# Patient Record
Sex: Female | Born: 1940 | Race: White | Hispanic: No | Marital: Married | Smoking: Never smoker
Health system: Southern US, Community
[De-identification: ages and names within clinical notes are randomized; demographics above are authoritative.]

## PROBLEM LIST (undated history)

## (undated) DIAGNOSIS — I10 Essential (primary) hypertension: Secondary | ICD-10-CM

## (undated) DIAGNOSIS — M1990 Unspecified osteoarthritis, unspecified site: Secondary | ICD-10-CM

## (undated) DIAGNOSIS — E039 Hypothyroidism, unspecified: Secondary | ICD-10-CM

## (undated) DIAGNOSIS — E7800 Pure hypercholesterolemia, unspecified: Secondary | ICD-10-CM

## (undated) DIAGNOSIS — I6609 Occlusion and stenosis of unspecified middle cerebral artery: Secondary | ICD-10-CM

## (undated) DIAGNOSIS — E119 Type 2 diabetes mellitus without complications: Secondary | ICD-10-CM

## (undated) DIAGNOSIS — I639 Cerebral infarction, unspecified: Secondary | ICD-10-CM

## (undated) HISTORY — PX: APPENDECTOMY: SHX54

## (undated) HISTORY — PX: CHOLECYSTECTOMY: SHX55

## (undated) HISTORY — PX: ABDOMINAL HYSTERECTOMY: SHX81

## (undated) HISTORY — DX: Occlusion and stenosis of unspecified middle cerebral artery: I66.09

## (undated) HISTORY — DX: Hypothyroidism, unspecified: E03.9

## (undated) HISTORY — DX: Cerebral infarction, unspecified: I63.9

## (undated) HISTORY — PX: ABDOMINAL HYSTERECTOMY: SUR658

## (undated) HISTORY — PX: COLONOSCOPY: SHX174

## (undated) HISTORY — DX: Type 2 diabetes mellitus without complications: E11.9

## (undated) HISTORY — DX: Pure hypercholesterolemia, unspecified: E78.00

---

## 2000-04-03 ENCOUNTER — Encounter: Payer: Self-pay | Admitting: *Deleted

## 2000-04-03 ENCOUNTER — Encounter: Admission: RE | Admit: 2000-04-03 | Discharge: 2000-04-03 | Payer: Self-pay | Admitting: *Deleted

## 2000-08-08 ENCOUNTER — Other Ambulatory Visit: Admission: RE | Admit: 2000-08-08 | Discharge: 2000-08-08 | Payer: Self-pay | Admitting: Obstetrics and Gynecology

## 2001-12-08 ENCOUNTER — Other Ambulatory Visit: Admission: RE | Admit: 2001-12-08 | Discharge: 2001-12-08 | Payer: Self-pay | Admitting: Obstetrics and Gynecology

## 2002-10-22 ENCOUNTER — Ambulatory Visit (HOSPITAL_COMMUNITY): Admission: RE | Admit: 2002-10-22 | Discharge: 2002-10-22 | Payer: Self-pay | Admitting: Family Medicine

## 2002-10-22 ENCOUNTER — Encounter: Payer: Self-pay | Admitting: Family Medicine

## 2003-06-04 ENCOUNTER — Ambulatory Visit (HOSPITAL_COMMUNITY): Admission: RE | Admit: 2003-06-04 | Discharge: 2003-06-04 | Payer: Self-pay | Admitting: Family Medicine

## 2003-06-04 ENCOUNTER — Encounter: Payer: Self-pay | Admitting: Family Medicine

## 2004-06-22 ENCOUNTER — Ambulatory Visit: Admission: RE | Admit: 2004-06-22 | Discharge: 2004-06-22 | Payer: Self-pay | Admitting: Family Medicine

## 2004-07-06 ENCOUNTER — Inpatient Hospital Stay (HOSPITAL_COMMUNITY): Admission: EM | Admit: 2004-07-06 | Discharge: 2004-07-11 | Payer: Self-pay | Admitting: Emergency Medicine

## 2004-11-01 ENCOUNTER — Ambulatory Visit (HOSPITAL_COMMUNITY): Admission: RE | Admit: 2004-11-01 | Discharge: 2004-11-01 | Payer: Self-pay | Admitting: Neurology

## 2004-12-01 ENCOUNTER — Ambulatory Visit (HOSPITAL_COMMUNITY): Admission: RE | Admit: 2004-12-01 | Discharge: 2004-12-01 | Payer: Self-pay | Admitting: Neurology

## 2005-06-05 ENCOUNTER — Ambulatory Visit (HOSPITAL_COMMUNITY): Admission: RE | Admit: 2005-06-05 | Discharge: 2005-06-05 | Payer: Self-pay | Admitting: Family Medicine

## 2005-09-20 ENCOUNTER — Ambulatory Visit (HOSPITAL_COMMUNITY): Admission: RE | Admit: 2005-09-20 | Discharge: 2005-09-20 | Payer: Self-pay | Admitting: Neurology

## 2005-09-26 ENCOUNTER — Encounter: Admission: RE | Admit: 2005-09-26 | Discharge: 2005-09-26 | Payer: Self-pay | Admitting: Neurology

## 2007-09-10 ENCOUNTER — Ambulatory Visit (HOSPITAL_COMMUNITY): Admission: RE | Admit: 2007-09-10 | Discharge: 2007-09-10 | Payer: Self-pay | Admitting: Family Medicine

## 2009-03-17 ENCOUNTER — Encounter: Admission: RE | Admit: 2009-03-17 | Discharge: 2009-03-17 | Payer: Self-pay | Admitting: Neurology

## 2009-04-04 ENCOUNTER — Encounter: Admission: RE | Admit: 2009-04-04 | Discharge: 2009-04-04 | Payer: Self-pay | Admitting: Family Medicine

## 2010-11-06 ENCOUNTER — Encounter: Admission: RE | Admit: 2010-11-06 | Discharge: 2010-11-06 | Payer: Self-pay | Admitting: Family Medicine

## 2011-05-18 NOTE — H&P (Signed)
[name], [name]                          ACCOUNT NO.:  [id number]   MEDICAL RECORD NO.:  [id number]                   PATIENT TYPE:  INP   LOCATION:  3036                                 FACILITY:  [facility]   PHYSICIAN:  [name], M.D.            DATE OF BIRTH:  06-16-1941   DATE OF ADMISSION:  07/06/2004  DATE OF DISCHARGE:                                HISTORY & PHYSICAL   PRIMARY CARE PHYSICIAN:  [name], M.D.   CHIEF COMPLAINT:  Numbness of left arm along with numbness of mouth and  tongue.   HISTORY OF PRESENT ILLNESS:  This is a 70 year old white female with past  medical history of hypertension and hypothyroidism who in the past month has  had two episodes of TIA-like symptoms.  She tells me that the first episode  started well over a month ago, and at that time she started having some  problems with left arm numbness and also some facial numbness.  Then again  after a second episode, the patient had already been on aspirin, and Plavix  was added to her regimen.  The patient at that time had an extensive workup  including an echocardiogram for which results are not available, but  according to her primary care physician were normal.  Carotid Dopplers were  also unremarkable, and an MRI/MRA of the brain done June 22, 2004.  The MRI  showed a tiny acute nonhemorrhagic infarct at the right centrum semiovale  and several scattered nonspecific tiny white matter type changes.  A  subcentimeter meningioma posterior to the crista galli  was noted without  any associated mass effect.  She was also noted to have on the MRA some  atherosclerotic disease.   The patient then was started on Plavix, and she since then has done  relatively well.  This morning she woke up and started complaining of some  left arm numbness with radiation to her jaw and mouth.  She had no  difficulty speaking, no problems with confusion, and no visual changes.  The  patient also states  that she felt like the numbness was going down her back  and into both of her legs.  This episode lasted approximately 30 minutes and  was somewhat relatively similar to her previous TIA episodes.  She became  concerned and called her primary care physician who advised her to come into  the emergency room.   In the emergency room after speaking with Dr. Tiburcio Travis, her primary care  physician, it was felt that patient would not need any further workup given  that she had already had a full extensive workup recently.  However, given  the fact that she has continued to have these TIA-like symptoms despite  being on aspirin and Plavix, it was felt the patient should be admitted for  Coumadin therapy to be started.  The patient herself tells me that her  symptoms  have completely resolved 30 minutes after they initiated, and she  now feels back to her baseline completely.  She has had no residual effects  from any of her TIAs.  Currently the patient denies any headaches or visual  changes.  She denies any dysphagia.  She denies any facial or arm numbness.  She denies any nausea, vomiting, chest pain, palpitations, shortness of  breath, wheeze, cough, abdominal pain, hematuria, dysuria, constipation,  diarrhea.  She denies any focal extremity weakness or numbness.   PAST MEDICAL HISTORY:  1. Hypertension.  2. Hypothyroidism.  3. She tells me she has also had a stress test in the past which was     negative.   MEDICATIONS:  1. Aspirin 325 mg daily.  2. Lisinopril 20 mg daily.  3. Plavix 75 mg daily.  4. Bisoprolol/hydrochlorothiazide 5/6.25 p.o. daily.  5. Os-Cal D 600 mg p.o. daily.   ALLERGIES:  No known drug allergies.   SOCIAL HISTORY:  She denies any tobacco, alcohol, or drug use.  She lives  with her husband.   FAMILY HISTORY:  Noncontributory.   PHYSICAL EXAMINATION:  VITAL SIGNS:  She is in normal sinus rhythm with a  heart rate of 68.  O2 saturation 98% on room air.   Respirations 20, blood  pressure initially elevated at 190/82.  Since then, her systolic has come  down to the 160s.  GENERAL:  Alert and oriented x 3 in no apparent distress.  HEENT:  Normocephalic and atraumatic.  She has no cranial nerve deficits.  NECK:  She has no carotid bruits.  HEART:  Regular rate and rhythm with what sounds like an S4 murmur.  LUNGS:  Clear to auscultation bilaterally.  ABDOMEN:  Soft, nontender, nondistended.  Positive bowel sounds.  EXTREMITIES:  No clubbing, cyanosis, or edema.  She has 2+ pulses.  NEUROLOGIC:  She has no focal neurological deficits.  She is negative for  Babinski sign.  She has no pronator drift.  She has no evidence of any  cerebellar dysfunction, no disydiadokokinesis.  Her strength is equal in all  four extremities for flexion, extension, and grip and is a 5/5.  No evidence  of any sensory deficits.   LABORATORY DATA:  None was done in the emergency room.  PT/INR has been  ordered and is pending.   IMPRESSION AND PLAN:  1. Recurrent transient ischemic attacks despite aspirin and Plavix.  She has     already had an extensive workup with a negative echocardiogram and     carotid Dopplers and MRI done on June 22, 2004.  After discussion with     the ER physician as well as Dr. Tiburcio Travis, we are not getting a repeat CT     today given that her symptoms have already resolved.  It is noted that     she did have a small tiny acute nonhemorrhagic infarct of the right     centrum semiovale noted on June 23.  We will go ahead and admit the     patient for full anticoagulation, Lovenox and Coumadin.  I have spoken     with her about this.  2. Hypertension.  Continue ACE and beta blocker.  3. Hypothyroidism.  Continue Synthroid.  4. Osteoporosis.  Continue Os-Cal.  5. Small hemangioma seen on MRI which is stable.  [name], M.D.   SKK/MEDQ  D:  07/06/2004  T:  07/06/2004  Job:   [id number]   cc:   [name], M.D.  [address]  [place], [place] [address]  Fax: [phone]

## 2011-05-18 NOTE — Discharge Summary (Signed)
[name], [name]                          ACCOUNT NO.:  [id number]   MEDICAL RECORD NO.:  [id number]                   PATIENT TYPE:  INP   LOCATION:  3036                                 FACILITY:  [facility]   PHYSICIAN:  Hollice Espy, M.D.            DATE OF BIRTH:  02/09/1941   DATE OF ADMISSION:  07/06/2004  DATE OF DISCHARGE:  07/11/2004                                 DISCHARGE SUMMARY   ATTENDING PHYSICIAN:  Hollice Espy, M.D.   PRIMARY CARE PHYSICIAN:  Holley Bouche, M.D.   DISCHARGE DIAGNOSES:  1. Recurrent transient ischemic attacks.  2. Recent cerebrovascular accident.  3. Hypertension.  4. Hypothyroidism.  5. Small hemangioma seen on MRI which is stable.   DISCHARGE MEDICATIONS:  1. She is to discontinue her Premarin.  2. Continue aspirin but now decrease dose to 81 mg p.o. daily.  3. She is to continue Ziac 5/6.25 one p.o. daily.  4. Synthroid 100 mcg one p.o. daily.  5. Lisinopril 20 mg one p.o. daily.  6. Os-Cal D.  7. New medications are:     a. Coumadin 3 mg p.o. q.h.s.     b. Zocor 20 mg p.o. q.h.s.   HISTORY OF PRESENT ILLNESS:  This is a 70 year old white female with past  medical history of hypertension and hypothyroidism who had two episodes in  the recent past month with TIA-like symptoms.  This usually presents as a  combination of blurry vision with numbness in the mouth and jaw and  radiation down to the left arm with arm numbness.  The patient had an  MRI/MRA done in June of 2005.  At that time she was noted that she had a  tiny acute nonhemorrhagic infarct at the right centrum semiovale and several  scattered nonspecific tiny white matter type changes.  Her MRA noted some  atherosclerotic disease.  __________ was started on Plavix, continued on  aspirin and she had done relatively well until she presented on July 06, 2004, with left arm numbness with radiation to her jaw and mouth.  No other  symptoms.  This episode lasted 30  minutes and resolved.  After discussion  with her PCP, Dr. Tiburcio Pea, extensive work-up had already been completed  including echo, MRI/MRA, and carotid Dopplers.  Given the fact that the  patient had recurrent TIA symptoms despite being on aspirin and Plavix, it  was felt that she would need more anticoagulation. The patient was admitted  and started on Coumadin protocol with Lovenox to bridge the gap until she  was therapeutic on her Coumadin.   HOSPITAL COURSE:  PROBLEM #1 -  RECURRENT TRANSIENT ISCHEMIC ATTACKS:  She  did relatively well.  She was continued on Lovenox, started on Coumadin.  Her INR started to trend up.  On July 10, 2004, the patient's INR was near  normal at 1.9. She complained of some blurriness on the afternoon of  July 09, 2004, not reported until July 10, 2004, and she also reported that she  had some early morning left arm numbness.  In discussion with the patient,  the patient sleeps with her hand up over her head and she could not say for  sure if this may have been the cause of her arm numbness, of course this did  not explain her previous blurry vision.  A CT scan of her head was done  which showed no evidence of any new infarct on July 10, 2004.  In addition,  the attending physician who was covering Dr. Baldo Daub, was concerned that  perhaps this may be some sort of cervical nerve impingement.  Films of the  cervical spine were done which showed some mild spondylosis but no evidence  of any significant impingement.  Again, given the patient's previous  infarct, it is likely that she is still having TIAs. Her Coumadin was  therapeutic on the morning of July 11, 2004, with INR of 2.4.  As per  pharmacy recommendations, her aspirin is being decreased down to 81 mg daily  and she will continue at a basal rate of 3 mg p.o. q.h.s. with a follow-up  INR sometime this week.   PROBLEM #2 -  BLOOD PRESSURE:  Her blood pressure remained relatively stable  with a systolic  initially elevated at 190s but since then has stayed stable  with systolic no higher than the 140s.  Her hypothyroidism has been stable.   PROBLEM #3 -  HYPERLIPIDEMIA:  The patient was noted to have a normal LDL  but low HDL and high triglycerides.  She was started on Zocor especially in  light of her recurrent TIAs.  The patient was started on Zocor which she has  tolerated well and the plan will be for her to continue this medication.   PLAN:  Discharge the patient to home.   DISPOSITION:  Improved.   ACTIVITY:  As tolerated.   FOLLOW UP:  She is to follow up with her PCP, Dr. Tiburcio Pea' office probably in  the next week.   DISCHARGE DIET:  Low sodium diet.                                                Hollice Espy, M.D.    SKK/MEDQ  D:  07/11/2004  T:  07/11/2004  Job:  [id number]   cc:   Nelva Nay, MD  Fax: [phone]   Holley Bouche, M.D.  [address]  [place], [place] [address]  Fax: [phone]

## 2012-01-10 DIAGNOSIS — E785 Hyperlipidemia, unspecified: Secondary | ICD-10-CM | POA: Diagnosis not present

## 2012-02-11 DIAGNOSIS — G5600 Carpal tunnel syndrome, unspecified upper limb: Secondary | ICD-10-CM | POA: Diagnosis not present

## 2012-02-11 DIAGNOSIS — G44219 Episodic tension-type headache, not intractable: Secondary | ICD-10-CM | POA: Diagnosis not present

## 2012-02-11 DIAGNOSIS — M542 Cervicalgia: Secondary | ICD-10-CM | POA: Diagnosis not present

## 2012-02-11 DIAGNOSIS — G43109 Migraine with aura, not intractable, without status migrainosus: Secondary | ICD-10-CM | POA: Diagnosis not present

## 2012-03-13 DIAGNOSIS — Z124 Encounter for screening for malignant neoplasm of cervix: Secondary | ICD-10-CM | POA: Diagnosis not present

## 2012-03-13 DIAGNOSIS — Z0000 Encounter for general adult medical examination without abnormal findings: Secondary | ICD-10-CM | POA: Diagnosis not present

## 2012-03-26 DIAGNOSIS — H2510 Age-related nuclear cataract, unspecified eye: Secondary | ICD-10-CM | POA: Diagnosis not present

## 2012-05-12 DIAGNOSIS — Z13820 Encounter for screening for osteoporosis: Secondary | ICD-10-CM | POA: Diagnosis not present

## 2012-05-12 DIAGNOSIS — Z1231 Encounter for screening mammogram for malignant neoplasm of breast: Secondary | ICD-10-CM | POA: Diagnosis not present

## 2012-06-02 DIAGNOSIS — E119 Type 2 diabetes mellitus without complications: Secondary | ICD-10-CM | POA: Diagnosis not present

## 2012-06-02 DIAGNOSIS — M79609 Pain in unspecified limb: Secondary | ICD-10-CM | POA: Diagnosis not present

## 2012-06-02 DIAGNOSIS — I10 Essential (primary) hypertension: Secondary | ICD-10-CM | POA: Diagnosis not present

## 2012-06-02 DIAGNOSIS — E039 Hypothyroidism, unspecified: Secondary | ICD-10-CM | POA: Diagnosis not present

## 2012-06-02 DIAGNOSIS — E785 Hyperlipidemia, unspecified: Secondary | ICD-10-CM | POA: Diagnosis not present

## 2012-07-31 DIAGNOSIS — L821 Other seborrheic keratosis: Secondary | ICD-10-CM | POA: Diagnosis not present

## 2012-07-31 DIAGNOSIS — L820 Inflamed seborrheic keratosis: Secondary | ICD-10-CM | POA: Diagnosis not present

## 2012-08-11 DIAGNOSIS — I672 Cerebral atherosclerosis: Secondary | ICD-10-CM | POA: Diagnosis not present

## 2012-08-11 DIAGNOSIS — R413 Other amnesia: Secondary | ICD-10-CM | POA: Diagnosis not present

## 2012-08-11 DIAGNOSIS — G43109 Migraine with aura, not intractable, without status migrainosus: Secondary | ICD-10-CM | POA: Diagnosis not present

## 2012-08-11 DIAGNOSIS — G4489 Other headache syndrome: Secondary | ICD-10-CM | POA: Diagnosis not present

## 2012-09-23 DIAGNOSIS — Z23 Encounter for immunization: Secondary | ICD-10-CM | POA: Diagnosis not present

## 2012-12-02 DIAGNOSIS — E785 Hyperlipidemia, unspecified: Secondary | ICD-10-CM | POA: Diagnosis not present

## 2012-12-02 DIAGNOSIS — I10 Essential (primary) hypertension: Secondary | ICD-10-CM | POA: Diagnosis not present

## 2012-12-02 DIAGNOSIS — R413 Other amnesia: Secondary | ICD-10-CM | POA: Diagnosis not present

## 2012-12-02 DIAGNOSIS — E039 Hypothyroidism, unspecified: Secondary | ICD-10-CM | POA: Diagnosis not present

## 2012-12-29 ENCOUNTER — Other Ambulatory Visit: Payer: Self-pay | Admitting: Diagnostic Neuroimaging

## 2012-12-29 DIAGNOSIS — R413 Other amnesia: Secondary | ICD-10-CM | POA: Diagnosis not present

## 2012-12-29 DIAGNOSIS — R6889 Other general symptoms and signs: Secondary | ICD-10-CM

## 2013-01-06 ENCOUNTER — Ambulatory Visit
Admission: RE | Admit: 2013-01-06 | Discharge: 2013-01-06 | Disposition: A | Discharge status: Home / Self Care | Payer: Medicare Other | Source: Ambulatory Visit | Attending: Diagnostic Neuroimaging | Admitting: Diagnostic Neuroimaging

## 2013-01-06 DIAGNOSIS — R413 Other amnesia: Secondary | ICD-10-CM

## 2013-01-06 DIAGNOSIS — R6889 Other general symptoms and signs: Secondary | ICD-10-CM | POA: Diagnosis not present

## 2013-03-03 DIAGNOSIS — E119 Type 2 diabetes mellitus without complications: Secondary | ICD-10-CM | POA: Diagnosis not present

## 2013-04-28 DIAGNOSIS — E119 Type 2 diabetes mellitus without complications: Secondary | ICD-10-CM | POA: Diagnosis not present

## 2013-05-11 DIAGNOSIS — I672 Cerebral atherosclerosis: Secondary | ICD-10-CM | POA: Diagnosis not present

## 2013-05-11 DIAGNOSIS — R51 Headache: Secondary | ICD-10-CM | POA: Diagnosis not present

## 2013-05-11 DIAGNOSIS — R413 Other amnesia: Secondary | ICD-10-CM | POA: Diagnosis not present

## 2013-05-14 DIAGNOSIS — Z1231 Encounter for screening mammogram for malignant neoplasm of breast: Secondary | ICD-10-CM | POA: Diagnosis not present

## 2013-05-18 DIAGNOSIS — E119 Type 2 diabetes mellitus without complications: Secondary | ICD-10-CM | POA: Diagnosis not present

## 2013-05-20 DIAGNOSIS — N63 Unspecified lump in unspecified breast: Secondary | ICD-10-CM | POA: Diagnosis not present

## 2013-06-02 ENCOUNTER — Ambulatory Visit (INDEPENDENT_AMBULATORY_CARE_PROVIDER_SITE_OTHER): Payer: Medicare Other | Admitting: Diagnostic Neuroimaging

## 2013-06-02 ENCOUNTER — Ambulatory Visit: Payer: Self-pay | Admitting: Diagnostic Neuroimaging

## 2013-06-02 ENCOUNTER — Encounter: Payer: Self-pay | Admitting: Diagnostic Neuroimaging

## 2013-06-02 VITALS — BP 144/70 | HR 78 | Temp 97.7°F | Ht 62.0 in | Wt 125.0 lb

## 2013-06-02 DIAGNOSIS — R413 Other amnesia: Secondary | ICD-10-CM

## 2013-06-02 DIAGNOSIS — R6889 Other general symptoms and signs: Secondary | ICD-10-CM

## 2013-06-02 NOTE — Patient Instructions (Signed)
Follow up as needed, will not schedule appointment for now.

## 2013-06-02 NOTE — Progress Notes (Signed)
[facility]  PATIENT: [name] DOB: 1941/07/21   HISTORY FROM:  Patient and sister-in-law REASON FOR VISIT: follow up for memory loss   HISTORICAL  CHIEF COMPLAINT:  Chief Complaint  Patient presents with  . Memory Loss    follow up    HISTORY OF PRESE NT ILLNESS:  UPDATE 06/02/13:  Here for followup of memory loss. Patient and sister-in-law do not feel that memory has gotten any worse in the last 6 months and her memory is stable. She had tried a trial off of statins for 6 months with no improvement in memory and patient is currently taking Pravachol. She states her memory has not been the same ever since she started taking generic Zocor. She is still able to do all of her ADLs including driving and taking care of the finances.  PRIOR HPI (12/29/12): 72 year old right-handed female with history of diabetes, hypercholesterolemia, stroke, bilateral MCA stenosis, hypothyroidism, here for followup of memory loss going on for the past 2 years.  Patient reports short-term memory loss, forgetting tasks and objects when she goes from room to room for the past 2 years. Patient notices this problem as well as her family and friends. She is concerned about contribution of cholesterol-lowering agent (statin) relationship to her memory problems. Otherwise she still takes care of most of activities of daily living.    REVIEW OF SYSTEMS: Full 14 system review of systems performed and notable only for cardiovascular: Swelling in legs, endocrine: Feeling cold, genitourinary: Urination problems, incontinence, neurological: Memory loss, numbness, sleep: Insomnia  ALLERGIES: No Known Allergies  HOME MEDICATIONS: No outpatient prescriptions prior to visit.   No facility-administered medications prior to visit.    PAST MEDICAL HISTORY: Past Medical History  Diagnosis Date  . Hypothyroidism (acquired)   . Diabetes   . Hypercholesterolemia   . Stroke   . Middle  cerebral artery stenosis     bilateral    PAST SURGICAL HISTORY: Past Surgical History  Procedure Laterality Date  . Cholecystectomy      FAMILY HISTORY: Family History  Problem Relation Age of Onset  . Alzheimer's disease Mother   . Heart Problems Mother   . Alzheimer's disease Sister     SOCIAL HISTORY: History   Social History  . Marital Status: Married    Spouse Name: Chryl Heck Recruitment consultant)    Number of Children: 2  . Years of Education: 12   Occupational History  . Not on file.   Social History Main Topics  . Smoking status: Never Smoker   . Smokeless tobacco: Never Used  . Alcohol Use: No  . Drug Use: No  . Sexually Active: Not on file   Other Topics Concern  . Not on file   Social History Narrative   Pt lives at home with her spouse.   Caffeine Use: 1 cup, very rarely     PHYSICAL EXAM  Filed Vitals:   06/02/13 1025  BP: 144/70  Pulse: 78  Temp: 97.7 F (36.5 C)  TempSrc: Oral  Height: 5\' 2"  (1.575 m)  Weight: 125 lb (56.7 kg)   Body mass index is 22.86 kg/(m^2).  GENERAL EXAM: Patient is in no distress, elderly Caucasian female.  CARDIOVASCULAR: Regular rate and rhythm, no murmurs, no carotid bruits  NEUROLOGIC: MENTAL STATUS: awake, alert, language fluent, comprehension intact, naming intact. MOCA 17/30, (previous MOCA 15/30, previous MMSE 27/30). BIGGEST DEFICIT IS SHORT-TERM RECALL AND SERIAL SEVENS. CRANIAL NERVE:  pupils equal and reactive to light, visual  fields full to confrontation, extraocular muscles intact, no nystagmus, facial sensation and strength symmetric, uvula midline, shoulder shrug symmetric, tongue midline. MOTOR: normal bulk and tone, full strength in the BUE, BLE SENSORY: normal and symmetric to light touch, pinprick, temperature, vibration and proprioception COORDINATION: finger-nose-finger, fine finger movements normal REFLEXES: deep tendon reflexes present and symmetric GAIT/STATION: narrow based gait; able to walk  on toes, heels and tandem; romberg is negative, fall risk score 7   DIAGNOSTIC DATA (LABS, IMAGING, TESTING) - I reviewed patient records, labs, notes, testing and imaging myself where available.  01/06/13 MRI brain - 1. Mild chronic small vessel ischemic disease. 2. Hyperostosis frontalis interna 3. Compared to prior MRI on 09/20/2005, there has been expected evolution of change from prior infarcts, and slight progression of hyperostosis frontalis.  09/20/05 MRI brain - New areas of acute right frontal cortical and subcortical infarction roughly lie between the distribution of the anterior and middle cerebral artery territories; the pattern of ischemia is most suggestive of a watershed type infarct.  12/01/04 cerebral angio - Severe preocclusive stenosis of the right middle cerebral artery in the M1 segment with hemodynamically delayed flow into the distal MCA circulation.  Severe focal stenosis of the proximal left middle cerebral artery in the M1 segment of 70 percent and greater.   ASSESSMENT AND PLAN  72 year old right-handed female with history of diabetes, hypercholesterolemia, stroke, bilateral MCA stenosis, hypothyroidism, here for evaluation of memory loss since 2012. Initial cognitive testing shows MMSE of 27/30. [place] cognitive assessment scored 15/30. Now 17/30. No frontal release signs. She has strong family history for Alzheimer's disease. While I cannot make diagnosis of dementia this time, I do think she has mild cognitive impairment. She may be at some increased risk for progression of dementia because of her family history and vascular history.  Ddx: mild cognitive impairment, mild dementia (alzheimers, vascular), metabolic  PLAN: 1. continue medical management of symptomatic MCA stenoses and stroke 2. offered Aricept to the patient,  But she feels like she does not want to start this at this time.  Recommend she follow up with her primary doctor and make an appointment in the  future if she or family members feels like her symptoms are getting worse.   Sesilia Poucher NP-C 06/02/2013, 11:31 AM  [facility] [address], [address] [place], [place] [address] [phone]   I reviewed note, spoke with patient and agree with plan.   Suanne Marker, MD 06/02/2013, 3:52 PM Certified in Neurology, Neurophysiology and Neuroimaging  [facility] [address], [address] [place], [place] [address] [phone]

## 2013-06-03 DIAGNOSIS — E039 Hypothyroidism, unspecified: Secondary | ICD-10-CM | POA: Diagnosis not present

## 2013-06-03 DIAGNOSIS — I10 Essential (primary) hypertension: Secondary | ICD-10-CM | POA: Diagnosis not present

## 2013-06-03 DIAGNOSIS — E7800 Pure hypercholesterolemia, unspecified: Secondary | ICD-10-CM | POA: Diagnosis not present

## 2013-06-03 DIAGNOSIS — E119 Type 2 diabetes mellitus without complications: Secondary | ICD-10-CM | POA: Diagnosis not present

## 2013-06-03 DIAGNOSIS — Z23 Encounter for immunization: Secondary | ICD-10-CM | POA: Diagnosis not present

## 2013-06-09 DIAGNOSIS — E119 Type 2 diabetes mellitus without complications: Secondary | ICD-10-CM | POA: Diagnosis not present

## 2013-06-16 DIAGNOSIS — E119 Type 2 diabetes mellitus without complications: Secondary | ICD-10-CM | POA: Diagnosis not present

## 2013-06-23 DIAGNOSIS — E119 Type 2 diabetes mellitus without complications: Secondary | ICD-10-CM | POA: Diagnosis not present

## 2013-07-07 DIAGNOSIS — E119 Type 2 diabetes mellitus without complications: Secondary | ICD-10-CM | POA: Diagnosis not present

## 2013-08-10 DIAGNOSIS — E119 Type 2 diabetes mellitus without complications: Secondary | ICD-10-CM | POA: Diagnosis not present

## 2013-09-26 DIAGNOSIS — Z23 Encounter for immunization: Secondary | ICD-10-CM | POA: Diagnosis not present

## 2013-10-08 DIAGNOSIS — E119 Type 2 diabetes mellitus without complications: Secondary | ICD-10-CM | POA: Diagnosis not present

## 2013-11-05 DIAGNOSIS — E119 Type 2 diabetes mellitus without complications: Secondary | ICD-10-CM | POA: Diagnosis not present

## 2013-11-23 DIAGNOSIS — N63 Unspecified lump in unspecified breast: Secondary | ICD-10-CM | POA: Diagnosis not present

## 2013-11-23 DIAGNOSIS — Z09 Encounter for follow-up examination after completed treatment for conditions other than malignant neoplasm: Secondary | ICD-10-CM | POA: Diagnosis not present

## 2013-12-08 DIAGNOSIS — E785 Hyperlipidemia, unspecified: Secondary | ICD-10-CM | POA: Diagnosis not present

## 2013-12-08 DIAGNOSIS — E039 Hypothyroidism, unspecified: Secondary | ICD-10-CM | POA: Diagnosis not present

## 2013-12-08 DIAGNOSIS — I10 Essential (primary) hypertension: Secondary | ICD-10-CM | POA: Diagnosis not present

## 2013-12-08 DIAGNOSIS — E119 Type 2 diabetes mellitus without complications: Secondary | ICD-10-CM | POA: Diagnosis not present

## 2014-01-14 DIAGNOSIS — E119 Type 2 diabetes mellitus without complications: Secondary | ICD-10-CM | POA: Diagnosis not present

## 2014-03-19 DIAGNOSIS — H2510 Age-related nuclear cataract, unspecified eye: Secondary | ICD-10-CM | POA: Diagnosis not present

## 2014-03-19 DIAGNOSIS — H3530 Unspecified macular degeneration: Secondary | ICD-10-CM | POA: Diagnosis not present

## 2014-03-19 DIAGNOSIS — E119 Type 2 diabetes mellitus without complications: Secondary | ICD-10-CM | POA: Diagnosis not present

## 2014-04-28 DIAGNOSIS — N644 Mastodynia: Secondary | ICD-10-CM | POA: Diagnosis not present

## 2014-04-28 DIAGNOSIS — R928 Other abnormal and inconclusive findings on diagnostic imaging of breast: Secondary | ICD-10-CM | POA: Diagnosis not present

## 2014-04-28 DIAGNOSIS — N3941 Urge incontinence: Secondary | ICD-10-CM | POA: Diagnosis not present

## 2014-05-19 DIAGNOSIS — E119 Type 2 diabetes mellitus without complications: Secondary | ICD-10-CM | POA: Diagnosis not present

## 2014-05-19 DIAGNOSIS — Z09 Encounter for follow-up examination after completed treatment for conditions other than malignant neoplasm: Secondary | ICD-10-CM | POA: Diagnosis not present

## 2014-05-19 DIAGNOSIS — Z803 Family history of malignant neoplasm of breast: Secondary | ICD-10-CM | POA: Diagnosis not present

## 2014-05-19 DIAGNOSIS — D249 Benign neoplasm of unspecified breast: Secondary | ICD-10-CM | POA: Diagnosis not present

## 2014-06-18 ENCOUNTER — Other Ambulatory Visit: Payer: Self-pay | Admitting: Family Medicine

## 2014-06-18 ENCOUNTER — Ambulatory Visit
Admission: RE | Admit: 2014-06-18 | Discharge: 2014-06-18 | Disposition: A | Discharge status: Home / Self Care | Payer: Medicare Other | Source: Ambulatory Visit | Attending: Family Medicine | Admitting: Family Medicine

## 2014-06-18 DIAGNOSIS — R071 Chest pain on breathing: Secondary | ICD-10-CM

## 2014-06-18 DIAGNOSIS — E785 Hyperlipidemia, unspecified: Secondary | ICD-10-CM | POA: Diagnosis not present

## 2014-06-18 DIAGNOSIS — E039 Hypothyroidism, unspecified: Secondary | ICD-10-CM | POA: Diagnosis not present

## 2014-06-18 DIAGNOSIS — R0789 Other chest pain: Secondary | ICD-10-CM

## 2014-06-18 DIAGNOSIS — I10 Essential (primary) hypertension: Secondary | ICD-10-CM | POA: Diagnosis not present

## 2014-06-18 DIAGNOSIS — F09 Unspecified mental disorder due to known physiological condition: Secondary | ICD-10-CM | POA: Diagnosis not present

## 2014-06-18 DIAGNOSIS — E119 Type 2 diabetes mellitus without complications: Secondary | ICD-10-CM | POA: Diagnosis not present

## 2014-08-03 DIAGNOSIS — L259 Unspecified contact dermatitis, unspecified cause: Secondary | ICD-10-CM | POA: Diagnosis not present

## 2014-08-03 DIAGNOSIS — L821 Other seborrheic keratosis: Secondary | ICD-10-CM | POA: Diagnosis not present

## 2014-08-03 DIAGNOSIS — L981 Factitial dermatitis: Secondary | ICD-10-CM | POA: Diagnosis not present

## 2014-09-14 ENCOUNTER — Ambulatory Visit (INDEPENDENT_AMBULATORY_CARE_PROVIDER_SITE_OTHER): Payer: Medicare Other | Admitting: Diagnostic Neuroimaging

## 2014-09-14 ENCOUNTER — Encounter: Payer: Self-pay | Admitting: Diagnostic Neuroimaging

## 2014-09-14 ENCOUNTER — Encounter (INDEPENDENT_AMBULATORY_CARE_PROVIDER_SITE_OTHER): Payer: Self-pay

## 2014-09-14 VITALS — BP 140/68 | HR 81 | Ht 60.0 in | Wt 118.0 lb

## 2014-09-14 DIAGNOSIS — R413 Other amnesia: Secondary | ICD-10-CM

## 2014-09-14 NOTE — Patient Instructions (Signed)
Eat berries, vegetables, nuts.  Exercise.  Drink water.  Stay active (mental, social, physical).

## 2014-09-14 NOTE — Progress Notes (Signed)
[facility]  PATIENT: [name] DOB: June 22, 1941   HISTORY FROM:  Patient and niece REASON FOR VISIT: follow up for memory loss   HISTORICAL  CHIEF COMPLAINT:  Chief Complaint  Patient presents with  . Follow-up    memory loss    HISTORY OF PRESENT ILLNESS:  UPDATE 09/14/14: Since last visit, memory loss slightly worse per niece, but patient feels stable. Still cooks, cleans, drives, shops. No issues.   UPDATE 06/02/13:  Here for followup of memory loss. Patient and sister-in-law do not feel that memory has gotten any worse in the last 6 months and her memory is stable. She had tried a trial off of statins for 6 months with no improvement in memory and patient is currently taking Pravachol. She states her memory has not been the same ever since she started taking generic Zocor. She is still able to do all of her ADLs including driving and taking care of the finances.  PRIOR HPI (12/29/12): 73 year old right-handed female with history of diabetes, hypercholesterolemia, stroke, bilateral MCA stenosis, hypothyroidism, here for followup of memory loss going on for the past 2 years.  Patient reports short-term memory loss, forgetting tasks and objects when she goes from room to room for the past 2 years. Patient notices this problem as well as her family and friends. She is concerned about contribution of cholesterol-lowering agent (statin) relationship to her memory problems. Otherwise she still takes care of most of activities of daily living.    REVIEW OF SYSTEMS: Full 14 system review of systems performed and notable only for cardiovascular: memory loss.   ALLERGIES: No Known Allergies  HOME MEDICATIONS: Outpatient Prescriptions Prior to Visit  Medication Sig Dispense Refill  . amLODipine (NORVASC) 10 MG tablet Take 10 mg by mouth daily.      . Ascorbic Acid (VITAMIN C) 100 MG tablet Take 100 mg by mouth 2 (two) times daily.      . calcium carbonate  (OS-CAL) 600 MG TABS Take 600 mg by mouth 2 (two) times daily with a meal.      . Coenzyme Q10-Omega 3 Fatty Acd (HEALTHY HEART) EMUL Take 1 tablet by mouth daily.      . hydrochlorothiazide (HYDRODIURIL) 25 MG tablet Take 25 mg by mouth daily.      Marland Kitchen levothyroxine (SYNTHROID, LEVOTHROID) 88 MCG tablet Take 88 mcg by mouth daily before breakfast.      . losartan (COZAAR) 100 MG tablet       . Lutein 6 MG CAPS Take 1 capsule by mouth daily.      . metFORMIN (GLUCOPHAGE) 500 MG tablet Take 500 mg by mouth 2 (two) times daily with a meal.      . OMEGA 3 1200 MG CAPS Take 2 capsules by mouth daily.      . pentoxifylline (TRENTAL) 400 MG CR tablet Take 400 mg by mouth 3 (three) times daily with meals.      . pravastatin (PRAVACHOL) 20 MG tablet Take 20 mg by mouth daily.       No facility-administered medications prior to visit.    PAST MEDICAL HISTORY: Past Medical History  Diagnosis Date  . Hypothyroidism (acquired)   . Diabetes   . Hypercholesterolemia   . Stroke   . Middle cerebral artery stenosis     bilateral    PAST SURGICAL HISTORY: Past Surgical History  Procedure Laterality Date  . Cholecystectomy      FAMILY HISTORY: Family History  Problem Relation Age of Onset  . Alzheimer's disease Mother   . Heart Problems Mother   . Alzheimer's disease Sister     SOCIAL HISTORY: History   Social History  . Marital Status: Married    Spouse Name: Norberto Sorenson Producer, television/film/video)    Number of Children: 2  . Years of Education: 12   Occupational History  . Not on file.   Social History Main Topics  . Smoking status: Never Smoker   . Smokeless tobacco: Never Used  . Alcohol Use: No  . Drug Use: No  . Sexual Activity: Not on file   Other Topics Concern  . Not on file   Social History Narrative   Pt lives at home with her spouse.   Caffeine Use: 1 cup, very rarely     PHYSICAL EXAM  Filed Vitals:   09/14/14 1301  BP: 140/68  Pulse: 81  Height: 5' (1.524 m)  Weight: 118  lb (53.524 kg)   Body mass index is 23.05 kg/(m^2).  GENERAL EXAM: Patient is in no distress, elderly female.  CARDIOVASCULAR: Regular rate and rhythm, no murmurs, no carotid bruits  NEUROLOGIC: MENTAL STATUS: awake, alert, language fluent, comprehension intact, naming intact. [place] 16/30. GDS 3. NO FRONTAL RELEASE SIGNS. CRANIAL NERVE:  pupils equal and reactive to light, visual fields full to confrontation, extraocular muscles intact, no nystagmus, facial sensation and strength symmetric, uvula midline, shoulder shrug symmetric, tongue midline. MOTOR: normal bulk and tone, full strength in the BUE, BLE SENSORY: normal and symmetric to light touch COORDINATION: finger-nose-finger, fine finger movements normal REFLEXES: deep tendon reflexes present and symmetric GAIT/STATION: narrow based gait  DIAGNOSTIC DATA (LABS, IMAGING, TESTING) - I reviewed patient records, labs, notes, testing and imaging myself where available.  01/06/13 MRI brain - 1. Mild chronic small vessel ischemic disease. 2. Hyperostosis frontalis interna 3. Compared to prior MRI on 09/20/2005, there has been expected evolution of change from prior infarcts, and slight progression of hyperostosis frontalis.  09/20/05 MRI brain - New areas of acute right frontal cortical and subcortical infarction roughly lie between the distribution of the anterior and middle cerebral artery territories; the pattern of ischemia is most suggestive of a watershed type infarct.  12/01/04 cerebral angio - Severe preocclusive stenosis of the right middle cerebral artery in the M1 segment with hemodynamically delayed flow into the distal MCA circulation.  Severe focal stenosis of the proximal left middle cerebral artery in the M1 segment of 70 percent and greater.   ASSESSMENT AND PLAN  73 y.o. right-handed female with history of diabetes, hypercholesterolemia, stroke, bilateral MCA stenosis, hypothyroidism, here for evaluation of memory loss  since 2012.   12/29/12 MMSE 27/30, AFT 9, MOCA 15/30, GDS 1 06/03/13     MOCA 17/30 09/14/14   MOCA 16/30, AFT 4, GDS 3  She has strong family history for Alzheimer's disease. While I cannot make diagnosis of dementia this time, I do think she has mild cognitive impairment. She may be at some increased risk for progression of dementia because of her family history and vascular history.  Ddx: mild cognitive impairment vs mild dementia (alzheimers, vascular)  PLAN: 1. continue medical management of symptomatic MCA stenoses and stroke 2. offered donepezil or namenda to the patient, but she feels like she does not want to start this at this time  Return in about 1 year (around 09/15/2015).   Penni Bombard, MD 1/76/1607, 3:71 PM Certified in Neurology, Neurophysiology and Neuroimaging  [facility] [address]  [address] [place], [place] [address] [phone]

## 2014-10-08 DIAGNOSIS — Z23 Encounter for immunization: Secondary | ICD-10-CM | POA: Diagnosis not present

## 2014-10-22 DIAGNOSIS — H43391 Other vitreous opacities, right eye: Secondary | ICD-10-CM | POA: Diagnosis not present

## 2014-10-22 DIAGNOSIS — E119 Type 2 diabetes mellitus without complications: Secondary | ICD-10-CM | POA: Diagnosis not present

## 2014-11-05 DIAGNOSIS — H43391 Other vitreous opacities, right eye: Secondary | ICD-10-CM | POA: Diagnosis not present

## 2014-11-05 DIAGNOSIS — E119 Type 2 diabetes mellitus without complications: Secondary | ICD-10-CM | POA: Diagnosis not present

## 2014-12-20 DIAGNOSIS — E780 Pure hypercholesterolemia: Secondary | ICD-10-CM | POA: Diagnosis not present

## 2014-12-20 DIAGNOSIS — E119 Type 2 diabetes mellitus without complications: Secondary | ICD-10-CM | POA: Diagnosis not present

## 2014-12-20 DIAGNOSIS — Z23 Encounter for immunization: Secondary | ICD-10-CM | POA: Diagnosis not present

## 2014-12-20 DIAGNOSIS — I10 Essential (primary) hypertension: Secondary | ICD-10-CM | POA: Diagnosis not present

## 2014-12-20 DIAGNOSIS — E039 Hypothyroidism, unspecified: Secondary | ICD-10-CM | POA: Diagnosis not present

## 2014-12-20 DIAGNOSIS — I679 Cerebrovascular disease, unspecified: Secondary | ICD-10-CM | POA: Diagnosis not present

## 2014-12-20 DIAGNOSIS — G3184 Mild cognitive impairment, so stated: Secondary | ICD-10-CM | POA: Diagnosis not present

## 2015-05-24 DIAGNOSIS — Z803 Family history of malignant neoplasm of breast: Secondary | ICD-10-CM | POA: Diagnosis not present

## 2015-05-24 DIAGNOSIS — Z1231 Encounter for screening mammogram for malignant neoplasm of breast: Secondary | ICD-10-CM | POA: Diagnosis not present

## 2015-06-08 DIAGNOSIS — H43391 Other vitreous opacities, right eye: Secondary | ICD-10-CM | POA: Diagnosis not present

## 2015-06-08 DIAGNOSIS — H3530 Unspecified macular degeneration: Secondary | ICD-10-CM | POA: Diagnosis not present

## 2015-06-08 DIAGNOSIS — E119 Type 2 diabetes mellitus without complications: Secondary | ICD-10-CM | POA: Diagnosis not present

## 2015-06-08 DIAGNOSIS — H2513 Age-related nuclear cataract, bilateral: Secondary | ICD-10-CM | POA: Diagnosis not present

## 2015-06-21 DIAGNOSIS — I10 Essential (primary) hypertension: Secondary | ICD-10-CM | POA: Diagnosis not present

## 2015-06-21 DIAGNOSIS — G3184 Mild cognitive impairment, so stated: Secondary | ICD-10-CM | POA: Diagnosis not present

## 2015-06-21 DIAGNOSIS — E039 Hypothyroidism, unspecified: Secondary | ICD-10-CM | POA: Diagnosis not present

## 2015-06-21 DIAGNOSIS — E780 Pure hypercholesterolemia: Secondary | ICD-10-CM | POA: Diagnosis not present

## 2015-06-21 DIAGNOSIS — E119 Type 2 diabetes mellitus without complications: Secondary | ICD-10-CM | POA: Diagnosis not present

## 2015-06-21 DIAGNOSIS — M546 Pain in thoracic spine: Secondary | ICD-10-CM | POA: Diagnosis not present

## 2015-06-21 DIAGNOSIS — Z1389 Encounter for screening for other disorder: Secondary | ICD-10-CM | POA: Diagnosis not present

## 2015-06-29 DIAGNOSIS — M546 Pain in thoracic spine: Secondary | ICD-10-CM | POA: Diagnosis not present

## 2015-06-29 DIAGNOSIS — Z9049 Acquired absence of other specified parts of digestive tract: Secondary | ICD-10-CM | POA: Diagnosis not present

## 2015-06-29 DIAGNOSIS — R109 Unspecified abdominal pain: Secondary | ICD-10-CM | POA: Diagnosis not present

## 2015-06-29 DIAGNOSIS — N281 Cyst of kidney, acquired: Secondary | ICD-10-CM | POA: Diagnosis not present

## 2015-08-29 DIAGNOSIS — E119 Type 2 diabetes mellitus without complications: Secondary | ICD-10-CM | POA: Diagnosis not present

## 2015-08-29 DIAGNOSIS — I679 Cerebrovascular disease, unspecified: Secondary | ICD-10-CM | POA: Diagnosis not present

## 2015-08-29 DIAGNOSIS — G3184 Mild cognitive impairment, so stated: Secondary | ICD-10-CM | POA: Diagnosis not present

## 2015-08-29 DIAGNOSIS — Z86010 Personal history of colonic polyps: Secondary | ICD-10-CM | POA: Diagnosis not present

## 2015-08-29 DIAGNOSIS — Z1211 Encounter for screening for malignant neoplasm of colon: Secondary | ICD-10-CM | POA: Diagnosis not present

## 2015-09-08 DIAGNOSIS — Z1211 Encounter for screening for malignant neoplasm of colon: Secondary | ICD-10-CM | POA: Diagnosis not present

## 2015-09-15 ENCOUNTER — Ambulatory Visit: Payer: Medicare Other | Admitting: Diagnostic Neuroimaging

## 2015-09-21 ENCOUNTER — Ambulatory Visit
Admission: RE | Admit: 2015-09-21 | Discharge: 2015-09-21 | Disposition: A | Discharge status: Home / Self Care | Payer: Medicare Other | Source: Ambulatory Visit | Attending: Physician Assistant | Admitting: Physician Assistant

## 2015-09-21 ENCOUNTER — Other Ambulatory Visit: Payer: Self-pay | Admitting: Physician Assistant

## 2015-09-21 DIAGNOSIS — M25511 Pain in right shoulder: Secondary | ICD-10-CM

## 2015-09-28 ENCOUNTER — Ambulatory Visit (INDEPENDENT_AMBULATORY_CARE_PROVIDER_SITE_OTHER): Payer: Medicare Other | Admitting: Diagnostic Neuroimaging

## 2015-09-28 ENCOUNTER — Encounter: Payer: Self-pay | Admitting: Diagnostic Neuroimaging

## 2015-09-28 VITALS — BP 146/63 | HR 75 | Ht 60.0 in | Wt 115.2 lb

## 2015-09-28 DIAGNOSIS — F03A0 Unspecified dementia, mild, without behavioral disturbance, psychotic disturbance, mood disturbance, and anxiety: Secondary | ICD-10-CM

## 2015-09-28 DIAGNOSIS — F0390 Unspecified dementia without behavioral disturbance: Secondary | ICD-10-CM

## 2015-09-28 NOTE — Patient Instructions (Signed)
Thank you for coming to see [place] at [facility]. I hope we have been able to provide you high quality care today.  You may receive a patient satisfaction survey over the next few weeks. We would appreciate your feedback and comments so that we may continue to improve ourselves and the health of our patients.  - I will setup home health referral - please transition away from driving  - ask family and friends for more support - caution with finances   ~~~~~~~~~~~~~~~~~~~~~~~~~~~~~~~~~~~~~~~~~~~~~~~~~~~~~~~~~~~~~~~~~  DR. PENUMALLI'S GUIDE TO HAPPY AND HEALTHY LIVING These are some of my general health and wellness recommendations. Some of them may apply to you better than others. Please use common sense as you try these suggestions and feel free to ask me any questions.   ACTIVITY/FITNESS Mental, social, emotional and physical stimulation are very important for brain and body health. Try learning a new activity (arts, music, language, sports, games).  Keep moving your body to the best of your abilities. You can do this at home, inside or outside, the park, community center, gym or anywhere you like. Consider a physical therapist or personal trainer to get started. Consider the app Sworkit. Fitness trackers such as smart-watches, smart-phones or Fitbits can help as well.   NUTRITION Eat more plants: colorful vegetables, nuts, seeds and berries.  Eat less sugar, salt, preservatives and processed foods.  Avoid toxins such as cigarettes and alcohol.  Drink water when you are thirsty. Warm water with a slice of lemon is an excellent morning drink to start the day.  Consider these websites for more information The Nutrition Source (https://www.henry-hernandez.biz/) Precision Nutrition (WindowBlog.ch)   RELAXATION Consider practicing mindfulness meditation or other relaxation techniques such as deep breathing, prayer, yoga, tai  chi, massage. See website mindful.org or the apps Headspace or Calm to help get started.   SLEEP Try to get at least 7-8+ hours sleep per day. Regular exercise and reduced caffeine will help you sleep better. Practice good sleep hygeine techniques. See website sleep.org for more information.   PLANNING Prepare estate planning, living will, healthcare POA documents. Sometimes this is best planned with the help of an attorney. Theconversationproject.org and agingwithdignity.org are excellent resources.
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
## 2015-10-07 ENCOUNTER — Telehealth: Payer: Self-pay | Admitting: Diagnostic Neuroimaging

## 2015-10-07 NOTE — Telephone Encounter (Signed)
Frankie/[facility] called to advise, Dr. Leta Baptist wanted patient seen in 2-3 days, patient doesn't want to be seen until Tuesday 10/11/15. Tharon Aquas just wanted [place] to be aware.

## 2015-10-08 DIAGNOSIS — Z23 Encounter for immunization: Secondary | ICD-10-CM | POA: Diagnosis not present

## 2015-10-11 DIAGNOSIS — E119 Type 2 diabetes mellitus without complications: Secondary | ICD-10-CM | POA: Diagnosis not present

## 2015-10-11 DIAGNOSIS — I10 Essential (primary) hypertension: Secondary | ICD-10-CM | POA: Diagnosis not present

## 2015-10-11 DIAGNOSIS — Z8673 Personal history of transient ischemic attack (TIA), and cerebral infarction without residual deficits: Secondary | ICD-10-CM | POA: Diagnosis not present

## 2015-10-11 DIAGNOSIS — F0390 Unspecified dementia without behavioral disturbance: Secondary | ICD-10-CM | POA: Diagnosis not present

## 2015-10-12 ENCOUNTER — Telehealth: Payer: Self-pay | Admitting: Diagnostic Neuroimaging

## 2015-10-12 NOTE — Telephone Encounter (Signed)
We can support patient and sister in law but should respect her decision. -VRP

## 2015-10-12 NOTE — Telephone Encounter (Signed)
[name], Clinical Staff Supervisor with [facility] called stating pt did not want husband or family to know that she has dementia. She has not told family of her dx, she told RN yesterday when admission was done and Education officer, museum today. She is calling to let Dr Leta Baptist be aware of this. She states pt is refusing service.

## 2015-12-01 DIAGNOSIS — I679 Cerebrovascular disease, unspecified: Secondary | ICD-10-CM | POA: Diagnosis not present

## 2015-12-01 DIAGNOSIS — I10 Essential (primary) hypertension: Secondary | ICD-10-CM | POA: Diagnosis not present

## 2015-12-01 DIAGNOSIS — E7800 Pure hypercholesterolemia, unspecified: Secondary | ICD-10-CM | POA: Diagnosis not present

## 2015-12-01 DIAGNOSIS — E039 Hypothyroidism, unspecified: Secondary | ICD-10-CM | POA: Diagnosis not present

## 2015-12-01 DIAGNOSIS — E119 Type 2 diabetes mellitus without complications: Secondary | ICD-10-CM | POA: Diagnosis not present

## 2015-12-01 DIAGNOSIS — G3184 Mild cognitive impairment, so stated: Secondary | ICD-10-CM | POA: Diagnosis not present

## 2015-12-29 IMAGING — CR DG CHEST 2V
2 series · 2 of 2 positions shown · non-contrast
Comparison: 01/06/2010

CLINICAL DATA: Right chest wall pain, painful respiration

EXAM:
CHEST  2 VIEW

[w chest pa]
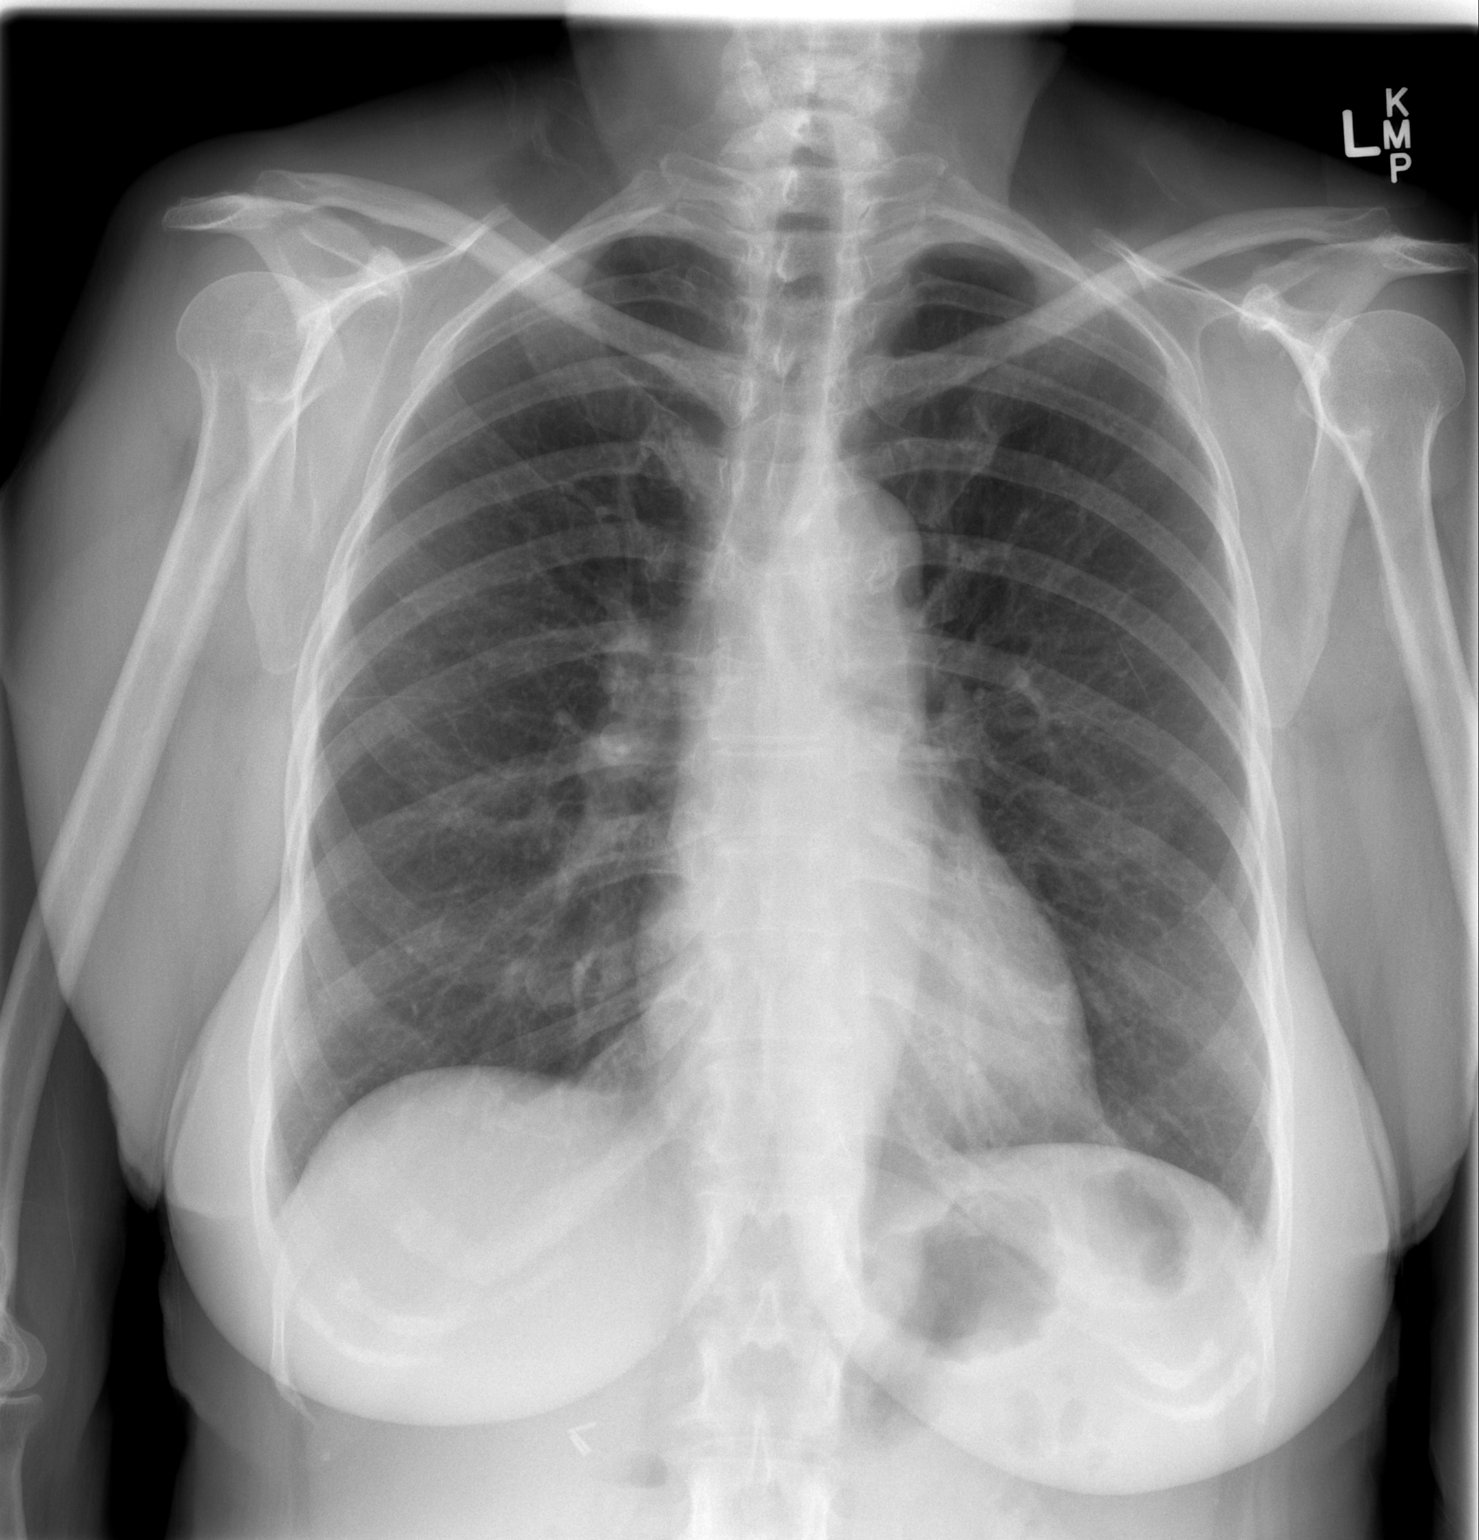

[w chest lat]
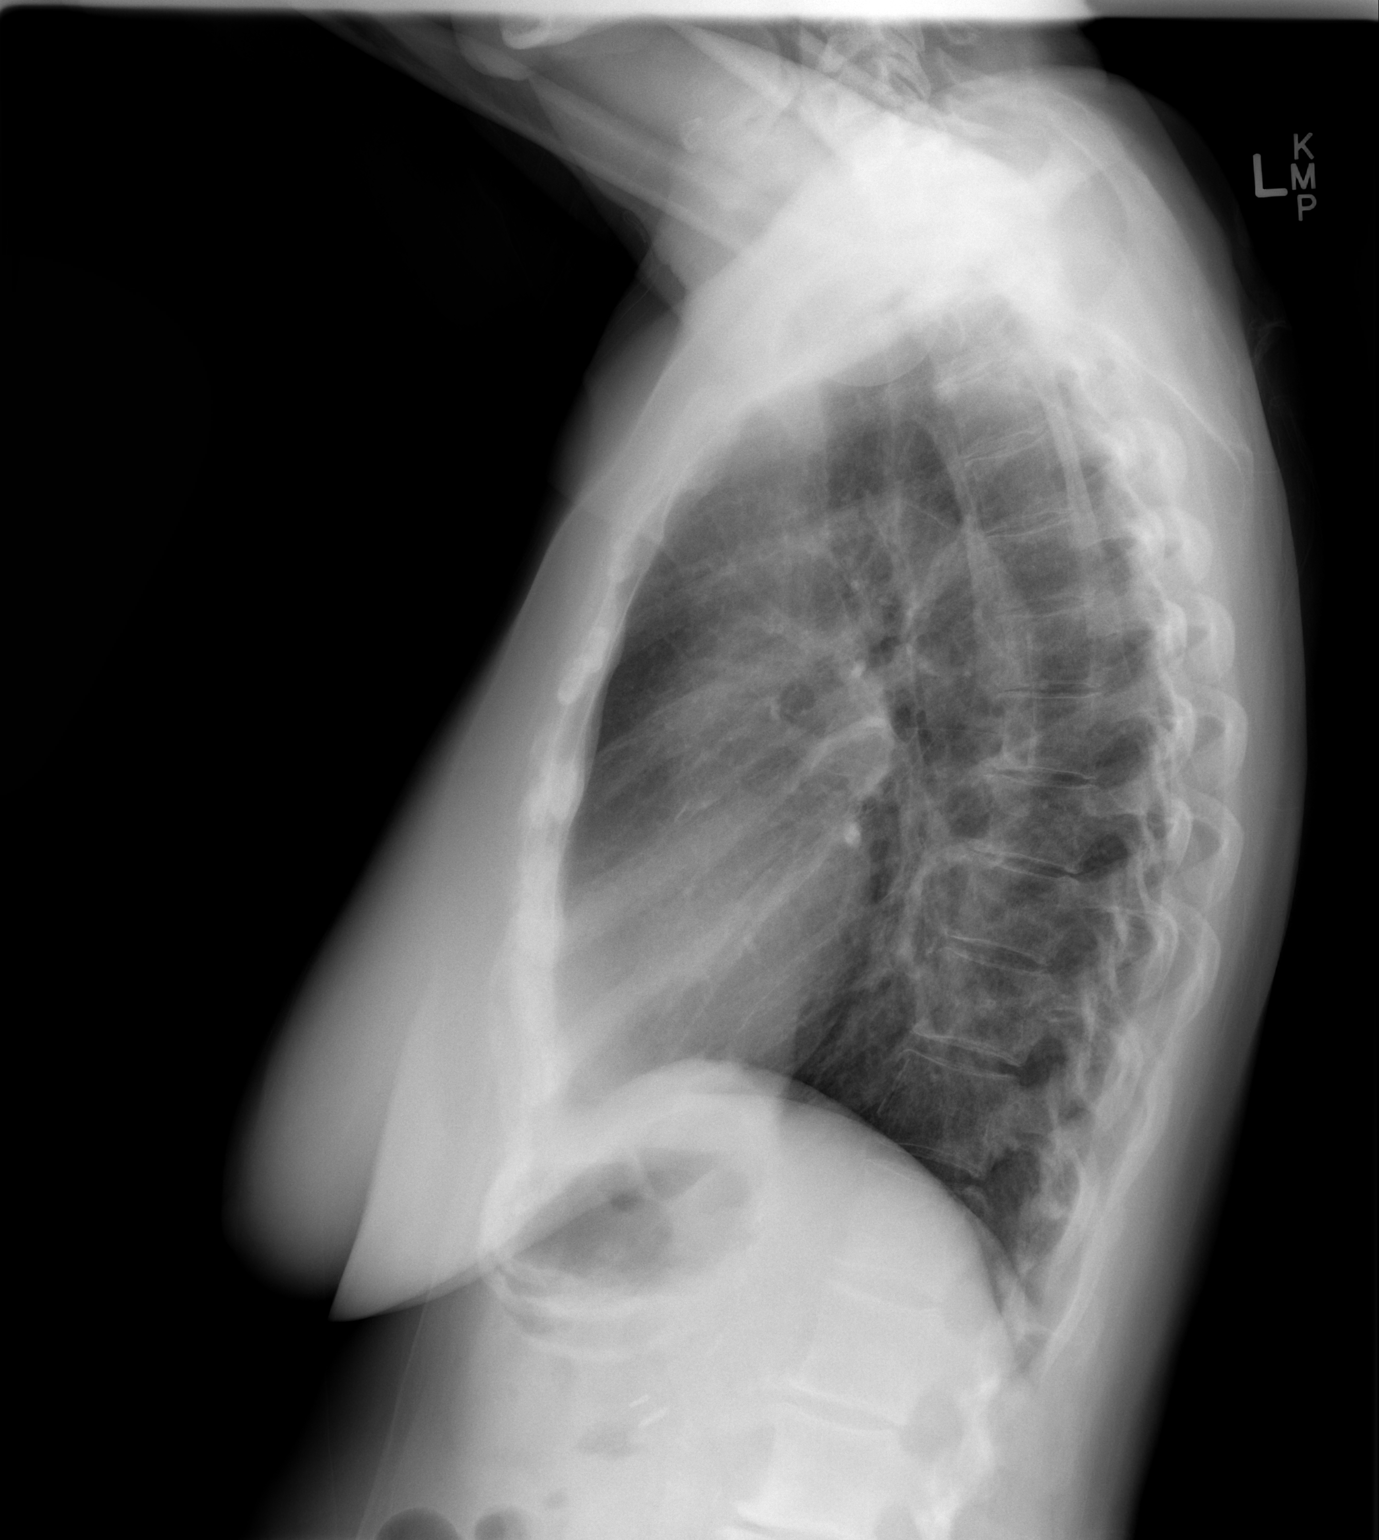

[2 of 2 positions shown; findings below may reference images not displayed]

FINDINGS: Cardiomediastinal silhouette is stable. No acute infiltrate or
pleural effusion. No pulmonary edema. Mild degenerative changes mid
thoracic spine. Post cholecystectomy surgical clips are noted.
IMPRESSION: No active cardiopulmonary disease.

## 2016-01-13 ENCOUNTER — Ambulatory Visit: Payer: Medicare Other | Admitting: Diagnostic Neuroimaging

## 2016-02-21 ENCOUNTER — Ambulatory Visit (INDEPENDENT_AMBULATORY_CARE_PROVIDER_SITE_OTHER): Payer: Medicare Other | Admitting: Diagnostic Neuroimaging

## 2016-02-21 ENCOUNTER — Encounter: Payer: Self-pay | Admitting: Diagnostic Neuroimaging

## 2016-02-21 ENCOUNTER — Encounter (INDEPENDENT_AMBULATORY_CARE_PROVIDER_SITE_OTHER): Payer: Self-pay

## 2016-02-21 VITALS — BP 134/61 | HR 74 | Ht 60.0 in | Wt 114.8 lb

## 2016-02-21 DIAGNOSIS — F0390 Unspecified dementia without behavioral disturbance: Secondary | ICD-10-CM

## 2016-02-21 DIAGNOSIS — F03A0 Unspecified dementia, mild, without behavioral disturbance, psychotic disturbance, mood disturbance, and anxiety: Secondary | ICD-10-CM
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
## 2016-02-21 NOTE — Patient Instructions (Signed)
-  continue current medications

## 2016-04-17 DIAGNOSIS — R300 Dysuria: Secondary | ICD-10-CM | POA: Diagnosis not present

## 2016-04-17 DIAGNOSIS — R350 Frequency of micturition: Secondary | ICD-10-CM | POA: Diagnosis not present

## 2016-04-17 DIAGNOSIS — J111 Influenza due to unidentified influenza virus with other respiratory manifestations: Secondary | ICD-10-CM | POA: Diagnosis not present

## 2016-05-23 DIAGNOSIS — Z1231 Encounter for screening mammogram for malignant neoplasm of breast: Secondary | ICD-10-CM | POA: Diagnosis not present

## 2016-05-23 DIAGNOSIS — Z803 Family history of malignant neoplasm of breast: Secondary | ICD-10-CM | POA: Diagnosis not present

## 2016-06-12 DIAGNOSIS — H353132 Nonexudative age-related macular degeneration, bilateral, intermediate dry stage: Secondary | ICD-10-CM | POA: Diagnosis not present

## 2016-06-12 DIAGNOSIS — H2513 Age-related nuclear cataract, bilateral: Secondary | ICD-10-CM | POA: Diagnosis not present

## 2016-06-12 DIAGNOSIS — E119 Type 2 diabetes mellitus without complications: Secondary | ICD-10-CM | POA: Diagnosis not present

## 2016-06-12 DIAGNOSIS — Z7984 Long term (current) use of oral hypoglycemic drugs: Secondary | ICD-10-CM | POA: Diagnosis not present

## 2016-06-18 DIAGNOSIS — E039 Hypothyroidism, unspecified: Secondary | ICD-10-CM | POA: Diagnosis not present

## 2016-06-18 DIAGNOSIS — E7800 Pure hypercholesterolemia, unspecified: Secondary | ICD-10-CM | POA: Diagnosis not present

## 2016-06-18 DIAGNOSIS — E119 Type 2 diabetes mellitus without complications: Secondary | ICD-10-CM | POA: Diagnosis not present

## 2016-06-18 DIAGNOSIS — Z139 Encounter for screening, unspecified: Secondary | ICD-10-CM | POA: Diagnosis not present

## 2016-06-18 DIAGNOSIS — I10 Essential (primary) hypertension: Secondary | ICD-10-CM | POA: Diagnosis not present

## 2016-07-31 DIAGNOSIS — H5711 Ocular pain, right eye: Secondary | ICD-10-CM | POA: Diagnosis not present

## 2016-08-27 DIAGNOSIS — Z23 Encounter for immunization: Secondary | ICD-10-CM | POA: Diagnosis not present

## 2016-09-12 DIAGNOSIS — E039 Hypothyroidism, unspecified: Secondary | ICD-10-CM | POA: Diagnosis not present

## 2016-09-12 DIAGNOSIS — I679 Cerebrovascular disease, unspecified: Secondary | ICD-10-CM | POA: Diagnosis not present

## 2016-09-12 DIAGNOSIS — G3184 Mild cognitive impairment, so stated: Secondary | ICD-10-CM | POA: Diagnosis not present

## 2016-09-12 DIAGNOSIS — E119 Type 2 diabetes mellitus without complications: Secondary | ICD-10-CM | POA: Diagnosis not present

## 2016-09-12 DIAGNOSIS — I10 Essential (primary) hypertension: Secondary | ICD-10-CM | POA: Diagnosis not present

## 2016-09-12 DIAGNOSIS — E7800 Pure hypercholesterolemia, unspecified: Secondary | ICD-10-CM | POA: Diagnosis not present

## 2016-11-05 DIAGNOSIS — R52 Pain, unspecified: Secondary | ICD-10-CM | POA: Diagnosis not present

## 2016-11-05 DIAGNOSIS — S60222A Contusion of left hand, initial encounter: Secondary | ICD-10-CM | POA: Diagnosis not present

## 2017-04-01 DIAGNOSIS — E119 Type 2 diabetes mellitus without complications: Secondary | ICD-10-CM | POA: Diagnosis not present

## 2017-04-01 DIAGNOSIS — E7800 Pure hypercholesterolemia, unspecified: Secondary | ICD-10-CM | POA: Diagnosis not present

## 2017-04-01 DIAGNOSIS — G3184 Mild cognitive impairment, so stated: Secondary | ICD-10-CM | POA: Diagnosis not present

## 2017-04-01 DIAGNOSIS — Z7984 Long term (current) use of oral hypoglycemic drugs: Secondary | ICD-10-CM | POA: Diagnosis not present

## 2017-04-01 DIAGNOSIS — I10 Essential (primary) hypertension: Secondary | ICD-10-CM | POA: Diagnosis not present

## 2017-04-01 DIAGNOSIS — N3281 Overactive bladder: Secondary | ICD-10-CM | POA: Diagnosis not present

## 2017-04-01 DIAGNOSIS — E039 Hypothyroidism, unspecified: Secondary | ICD-10-CM | POA: Diagnosis not present

## 2017-04-01 DIAGNOSIS — N390 Urinary tract infection, site not specified: Secondary | ICD-10-CM | POA: Diagnosis not present

## 2017-04-17 ENCOUNTER — Institutional Professional Consult (permissible substitution): Payer: Medicare Other | Admitting: Diagnostic Neuroimaging

## 2017-04-19 DIAGNOSIS — Z1231 Encounter for screening mammogram for malignant neoplasm of breast: Secondary | ICD-10-CM | POA: Diagnosis not present

## 2017-04-26 DIAGNOSIS — H3530 Unspecified macular degeneration: Secondary | ICD-10-CM | POA: Diagnosis not present

## 2017-04-26 DIAGNOSIS — H2513 Age-related nuclear cataract, bilateral: Secondary | ICD-10-CM | POA: Diagnosis not present

## 2017-04-26 DIAGNOSIS — E119 Type 2 diabetes mellitus without complications: Secondary | ICD-10-CM | POA: Diagnosis not present

## 2017-06-21 DIAGNOSIS — H2511 Age-related nuclear cataract, right eye: Secondary | ICD-10-CM | POA: Diagnosis not present

## 2017-06-26 DIAGNOSIS — H2511 Age-related nuclear cataract, right eye: Secondary | ICD-10-CM | POA: Diagnosis not present

## 2017-07-04 ENCOUNTER — Encounter: Payer: Self-pay | Admitting: *Deleted

## 2017-07-16 ENCOUNTER — Ambulatory Visit
Admission: RE | Admit: 2017-07-16 | Discharge: 2017-07-16 | Disposition: A | Discharge status: Home / Self Care | Payer: Medicare Other | Source: Ambulatory Visit | Attending: Ophthalmology | Admitting: Ophthalmology

## 2017-07-16 ENCOUNTER — Ambulatory Visit: Payer: Medicare Other | Admitting: Anesthesiology

## 2017-07-16 ENCOUNTER — Encounter: Payer: Self-pay | Admitting: *Deleted

## 2017-07-16 ENCOUNTER — Encounter
Admission: RE | Disposition: A | Discharge status: Home / Self Care | Payer: Self-pay | Source: Ambulatory Visit | Attending: Ophthalmology

## 2017-07-16 DIAGNOSIS — I6603 Occlusion and stenosis of bilateral middle cerebral arteries: Secondary | ICD-10-CM | POA: Insufficient documentation

## 2017-07-16 DIAGNOSIS — E119 Type 2 diabetes mellitus without complications: Secondary | ICD-10-CM | POA: Diagnosis not present

## 2017-07-16 DIAGNOSIS — Z79899 Other long term (current) drug therapy: Secondary | ICD-10-CM | POA: Diagnosis not present

## 2017-07-16 DIAGNOSIS — I10 Essential (primary) hypertension: Secondary | ICD-10-CM | POA: Diagnosis not present

## 2017-07-16 DIAGNOSIS — H2511 Age-related nuclear cataract, right eye: Secondary | ICD-10-CM | POA: Insufficient documentation

## 2017-07-16 DIAGNOSIS — E7800 Pure hypercholesterolemia, unspecified: Secondary | ICD-10-CM | POA: Insufficient documentation

## 2017-07-16 DIAGNOSIS — Z7984 Long term (current) use of oral hypoglycemic drugs: Secondary | ICD-10-CM | POA: Insufficient documentation

## 2017-07-16 DIAGNOSIS — E039 Hypothyroidism, unspecified: Secondary | ICD-10-CM | POA: Insufficient documentation

## 2017-07-16 DIAGNOSIS — Z8673 Personal history of transient ischemic attack (TIA), and cerebral infarction without residual deficits: Secondary | ICD-10-CM | POA: Diagnosis not present

## 2017-07-16 HISTORY — DX: Unspecified osteoarthritis, unspecified site: M19.90

## 2017-07-16 HISTORY — PX: CATARACT EXTRACTION W/PHACO: SHX586

## 2017-07-16 HISTORY — DX: Essential (primary) hypertension: I10

## 2017-07-16 LAB — GLUCOSE, CAPILLARY: GLUCOSE-CAPILLARY: 193 mg/dL — AB (ref 65–99)

## 2017-07-16 SURGERY — PHACOEMULSIFICATION, CATARACT, WITH IOL INSERTION
Anesthesia: Monitor Anesthesia Care | Site: Eye | Laterality: Right | Wound class: Clean

## 2017-07-16 MED ORDER — POVIDONE-IODINE 5 % OP SOLN
OPHTHALMIC | Status: DC | PRN
  Administered 2017-07-16: 1 via OPHTHALMIC

## 2017-07-16 MED ORDER — EPINEPHRINE 30 MG/30ML IJ SOLN
INTRAMUSCULAR | Status: AC
  Filled 2017-07-16: qty 1

## 2017-07-16 MED ORDER — TRYPAN BLUE 0.06 % OP SOLN
OPHTHALMIC | Status: AC
  Filled 2017-07-16: qty 0.5

## 2017-07-16 MED ORDER — LIDOCAINE HCL (PF) 4 % IJ SOLN
INTRAOCULAR | Status: DC | PRN
  Administered 2017-07-16: 2 mL via OPHTHALMIC

## 2017-07-16 MED ORDER — ALFENTANIL 500 MCG/ML IJ INJ
INJECTION | INTRAVENOUS | Status: DC | PRN
  Administered 2017-07-16: 250 ug via INTRAVENOUS

## 2017-07-16 MED ORDER — NA CHONDROIT SULF-NA HYALURON 40-17 MG/ML IO SOLN
INTRAOCULAR | Status: DC | PRN
  Administered 2017-07-16: 1 mL via INTRAOCULAR

## 2017-07-16 MED ORDER — LIDOCAINE HCL (PF) 4 % IJ SOLN
INTRAMUSCULAR | Status: AC
  Filled 2017-07-16: qty 5

## 2017-07-16 MED ORDER — NA CHONDROIT SULF-NA HYALURON 40-17 MG/ML IO SOLN
INTRAOCULAR | Status: AC
  Filled 2017-07-16: qty 1

## 2017-07-16 MED ORDER — LIDOCAINE HCL (PF) 2 % IJ SOLN
INTRAMUSCULAR | Status: AC
  Filled 2017-07-16: qty 2

## 2017-07-16 MED ORDER — MOXIFLOXACIN HCL 0.5 % OP SOLN
OPHTHALMIC | Status: AC
  Filled 2017-07-16: qty 3

## 2017-07-16 MED ORDER — CARBACHOL 0.01 % IO SOLN
INTRAOCULAR | Status: DC | PRN
  Administered 2017-07-16: .5 mL via INTRAOCULAR

## 2017-07-16 MED ORDER — ARMC OPHTHALMIC DILATING DROPS
OPHTHALMIC | Status: AC
  Filled 2017-07-16: qty 0.4

## 2017-07-16 MED ORDER — MOXIFLOXACIN HCL 0.5 % OP SOLN
OPHTHALMIC | Status: DC | PRN
  Administered 2017-07-16: .2 mL via OPHTHALMIC

## 2017-07-16 MED ORDER — MIDAZOLAM HCL 2 MG/2ML IJ SOLN
INTRAMUSCULAR | Status: DC | PRN
  Administered 2017-07-16: 0.5 mg via INTRAVENOUS

## 2017-07-16 MED ORDER — SODIUM CHLORIDE 0.9 % IV SOLN
INTRAVENOUS | Status: DC
Start: 2017-07-16 — End: 2017-07-16
  Administered 2017-07-16 (×2): via INTRAVENOUS

## 2017-07-16 MED ORDER — ARMC OPHTHALMIC DILATING DROPS
1.0000 "application " | OPHTHALMIC | Status: AC
  Administered 2017-07-16 (×3): 1 via OPHTHALMIC

## 2017-07-16 MED ORDER — MIDAZOLAM HCL 2 MG/2ML IJ SOLN
INTRAMUSCULAR | Status: AC
  Filled 2017-07-16: qty 2

## 2017-07-16 MED ORDER — EPINEPHRINE PF 1 MG/ML IJ SOLN
INTRAOCULAR | Status: DC | PRN
  Administered 2017-07-16: 1 mL via OPHTHALMIC

## 2017-07-16 MED ORDER — POVIDONE-IODINE 5 % OP SOLN
OPHTHALMIC | Status: AC
  Filled 2017-07-16: qty 30

## 2017-07-16 MED ORDER — MOXIFLOXACIN HCL 0.5 % OP SOLN: 1.0000 [drp] | OPHTHALMIC | Status: DC | PRN

## 2017-07-16 MED ORDER — PROPOFOL 10 MG/ML IV BOLUS
INTRAVENOUS | Status: AC
  Filled 2017-07-16: qty 20

## 2017-07-16 MED ORDER — ALFENTANIL 500 MCG/ML IJ INJ
INJECTION | INTRAVENOUS | Status: AC
  Filled 2017-07-16: qty 5

## 2017-07-16 SURGICAL SUPPLY — 16 items
GLOVE BIO SURGEON STRL SZ8 (GLOVE) ×3 IMPLANT
GLOVE BIOGEL M 6.5 STRL (GLOVE) ×3 IMPLANT
GLOVE SURG LX 8.0 MICRO (GLOVE) ×2
GLOVE SURG LX STRL 8.0 MICRO (GLOVE) ×1 IMPLANT
GOWN STRL REUS W/ TWL LRG LVL3 (GOWN DISPOSABLE) ×2 IMPLANT
GOWN STRL REUS W/TWL LRG LVL3 (GOWN DISPOSABLE) ×4
LABEL CATARACT MEDS ST (LABEL) ×3 IMPLANT
LENS IOL TECNIS ITEC 22.5 (Intraocular Lens) ×3 IMPLANT
PACK CATARACT (MISCELLANEOUS) ×3 IMPLANT
PACK CATARACT BRASINGTON LX (MISCELLANEOUS) ×3 IMPLANT
PACK EYE AFTER SURG (MISCELLANEOUS) ×3 IMPLANT
SOL BSS BAG (MISCELLANEOUS) ×3
SOLUTION BSS BAG (MISCELLANEOUS) ×1 IMPLANT
SYR 5ML LL (SYRINGE) ×3 IMPLANT
WATER STERILE IRR 250ML POUR (IV SOLUTION) ×3 IMPLANT
WIPE NON LINTING 3.25X3.25 (MISCELLANEOUS) ×3 IMPLANT

## 2017-07-16 NOTE — Op Note (Signed)
PREOPERATIVE DIAGNOSIS:  Nuclear sclerotic cataract of the right eye.   POSTOPERATIVE DIAGNOSIS:  nuclear sclerotic cataract right eye   OPERATIVE PROCEDURE: Procedure(s): CATARACT EXTRACTION PHACO AND INTRAOCULAR LENS PLACEMENT (IOC)   SURGEON:  Birder Robson, MD.   ANESTHESIA:  Anesthesiologist: Martha Clan, MD CRNA: Courtney Paris, CRNA  1.      Managed anesthesia care. 2.      0.69ml of Shugarcaine was instilled in the eye following the paracentesis.   COMPLICATIONS:  None.   TECHNIQUE:   Stop and chop   DESCRIPTION OF PROCEDURE:  The patient was examined and consented in the preoperative holding area where the aforementioned topical anesthesia was applied to the right eye and then brought back to the Operating Room where the right eye was prepped and draped in the usual sterile ophthalmic fashion and a lid speculum was placed. A paracentesis was created with the side port blade and the anterior chamber was filled with viscoelastic. A near clear corneal incision was performed with the steel keratome. A continuous curvilinear capsulorrhexis was performed with a cystotome followed by the capsulorrhexis forceps. Hydrodissection and hydrodelineation were carried out with BSS on a blunt cannula. The lens was removed in a stop and chop  technique and the remaining cortical material was removed with the irrigation-aspiration handpiece. The capsular bag was inflated with viscoelastic and the Technis [id number]  lens was placed in the capsular bag without complication. The remaining viscoelastic was removed from the eye with the irrigation-aspiration handpiece. The wounds were hydrated. The anterior chamber was flushed with Miostat and the eye was inflated to physiologic pressure. 0.66ml of Vigamox was placed in the anterior chamber. The wounds were found to be water tight. The eye was dressed with Vigamox. The patient was given protective glasses to wear throughout the day and a shield with which to  sleep tonight. The patient was also given drops with which to begin a drop regimen today and will follow-up with me in one day.  Implant Name Type Inv. Item Serial No. Manufacturer Lot No. LRB No. Used  LENS IOL DIOP 22.5 - [id number] Intraocular Lens LENS IOL DIOP 22.5 [id number] AMO   Right 1   Procedure(s) with comments: CATARACT EXTRACTION PHACO AND INTRAOCULAR LENS PLACEMENT (IOC) (Right) - [place]  00:55 AP% 16.6 CDE 9.21 Fluid pack lot # [id number]  Electronically signed: [place] 07/16/2017 7:46 AM

## 2017-07-16 NOTE — Discharge Instructions (Signed)
Eye Surgery Discharge Instructions  Expect mild scratchy sensation or mild soreness. DO NOT RUB YOUR EYE!  The day of surgery:  Minimal physical activity, but bed rest is not required  No reading, computer work, or close hand work  No bending, lifting, or straining.  May watch TV  For 24 hours:  No driving, legal decisions, or alcoholic beverages  Safety precautions  Eat anything you prefer: It is better to start with liquids, then soup then solid foods.  _____ Eye patch should be worn until postoperative exam tomorrow.  ____ Solar shield eyeglasses should be worn for comfort in the sunlight/patch while sleeping  Resume all regular medications including aspirin or Coumadin if these were discontinued prior to surgery. You may shower, bathe, shave, or wash your hair. Tylenol may be taken for mild discomfort.  Call your doctor if you experience significant pain, nausea, or vomiting, fever > 101 or other signs of infection. [phone] or [phone] Specific instructions:  Follow-up Information    Birder Robson, MD Follow up on 07/16/2017.   Specialty:  Ophthalmology Why:  2:15 Contact information: [address] [place] [address] [phone]

## 2017-07-16 NOTE — Transfer of Care (Signed)
Immediate Anesthesia Transfer of Care Note  Patient: [name]  Procedure(s) Performed: Procedure(s) with comments: CATARACT EXTRACTION PHACO AND INTRAOCULAR LENS PLACEMENT (IOC) (Right) - [place]  00:55 AP% 16.6 CDE 9.21 Fluid pack lot # [id number]  Patient Location: PACU and Short Stay  Anesthesia Type:MAC  Level of Consciousness: awake and patient cooperative  Airway & Oxygen Therapy: Patient Spontanous Breathing  Post-op Assessment: Report given to RN and Post -op Vital signs reviewed and stable  Post vital signs: Reviewed and stable  Last Vitals:  Vitals:   07/16/17 0612  BP: (!) 154/48  Pulse: 61  Resp: 18  Temp: (!) 36.2 C    Last Pain:  Vitals:   07/16/17 0612  TempSrc: Tympanic         Complications: No apparent anesthesia complications

## 2017-07-16 NOTE — Anesthesia Preprocedure Evaluation (Signed)
Anesthesia Evaluation  Patient identified by MRN, date of birth, ID band Patient awake    Reviewed: Allergy & Precautions, H&P , NPO status , Patient's Chart, lab work & pertinent test results, reviewed documented beta blocker date and time   History of Anesthesia Complications Negative for: history of anesthetic complications  Airway Mallampati: II  TM Distance: >3 FB Neck ROM: full    Dental  (+) Partial Upper, Partial Lower, Dental Advidsory Given   Pulmonary neg pulmonary ROS,           Cardiovascular Exercise Tolerance: Good hypertension, (-) angina(-) CAD, (-) Past MI, (-) Cardiac Stents and (-) CABG (-) dysrhythmias (-) Valvular Problems/Murmurs     Neuro/Psych neg Seizures CVA, No Residual Symptoms negative psych ROS   GI/Hepatic negative GI ROS, Neg liver ROS,   Endo/Other  diabetesHypothyroidism   Renal/GU negative Renal ROS  negative genitourinary   Musculoskeletal   Abdominal   Peds  Hematology negative hematology ROS (+)   Anesthesia Other Findings Past Medical History: No date: Arthritis No date: Diabetes (HCC) No date: Hypercholesterolemia No date: Hypertension No date: Hypothyroidism (acquired) No date: Middle cerebral artery stenosis     Comment:  bilateral No date: Stroke ([place])   Reproductive/Obstetrics negative OB ROS                             Anesthesia Physical Anesthesia Plan  ASA: III  Anesthesia Plan: MAC   Post-op Pain Management:    Induction:   PONV Risk Score and Plan: 2 and Ondansetron and Dexamethasone  Airway Management Planned:   Additional Equipment:   Intra-op Plan:   Post-operative Plan:   Informed Consent: I have reviewed the patients History and Physical, chart, labs and discussed the procedure including the risks, benefits and alternatives for the proposed anesthesia with the patient or authorized representative who has indicated  his/her understanding and acceptance.   Dental Advisory Given  Plan Discussed with: Anesthesiologist, CRNA and Surgeon  Anesthesia Plan Comments:         Anesthesia Quick Evaluation

## 2017-07-16 NOTE — Anesthesia Postprocedure Evaluation (Signed)
Anesthesia Post Note  Patient: AIDYNN POLENDO  Procedure(s) Performed: Procedure(s) (LRB): CATARACT EXTRACTION PHACO AND INTRAOCULAR LENS PLACEMENT (IOC) (Right)  Patient location during evaluation: PACU Anesthesia Type: MAC Level of consciousness: awake and alert Pain management: pain level controlled Vital Signs Assessment: post-procedure vital signs reviewed and stable Respiratory status: spontaneous breathing, nonlabored ventilation, respiratory function stable and patient connected to nasal cannula oxygen Cardiovascular status: stable and blood pressure returned to baseline Anesthetic complications: no     Last Vitals:  Vitals:   07/16/17 0750 07/16/17 0821  BP: (!) 143/51 (!) 132/58  Pulse:  70  Resp: 16 16  Temp: 36.6 C     Last Pain:  Vitals:   07/16/17 0612  TempSrc: Tympanic                 Martha Clan

## 2017-07-16 NOTE — H&P (Signed)
All labs reviewed. Abnormal studies sent to patients PCP when indicated.  Previous H&P reviewed, patient examined, there are NO CHANGES.  [name] LOUIS7/17/20187:06 AM

## 2017-07-16 NOTE — Anesthesia Post-op Follow-up Note (Cosign Needed)
Anesthesia QCDR form completed.        

## 2017-07-17 ENCOUNTER — Encounter: Payer: Self-pay | Admitting: Ophthalmology

## 2017-07-29 DIAGNOSIS — H2512 Age-related nuclear cataract, left eye: Secondary | ICD-10-CM | POA: Diagnosis not present

## 2017-07-31 ENCOUNTER — Encounter: Payer: Self-pay | Admitting: *Deleted

## 2017-08-06 ENCOUNTER — Ambulatory Visit
Admission: RE | Admit: 2017-08-06 | Discharge: 2017-08-06 | Disposition: A | Discharge status: Home / Self Care | Payer: Medicare Other | Source: Ambulatory Visit | Attending: Ophthalmology | Admitting: Ophthalmology

## 2017-08-06 ENCOUNTER — Encounter: Payer: Self-pay | Admitting: Anesthesiology

## 2017-08-06 ENCOUNTER — Ambulatory Visit: Payer: Medicare Other | Admitting: Anesthesiology

## 2017-08-06 ENCOUNTER — Encounter
Admission: RE | Disposition: A | Discharge status: Home / Self Care | Payer: Self-pay | Source: Ambulatory Visit | Attending: Ophthalmology

## 2017-08-06 DIAGNOSIS — Z79899 Other long term (current) drug therapy: Secondary | ICD-10-CM | POA: Diagnosis not present

## 2017-08-06 DIAGNOSIS — Z791 Long term (current) use of non-steroidal anti-inflammatories (NSAID): Secondary | ICD-10-CM | POA: Insufficient documentation

## 2017-08-06 DIAGNOSIS — E119 Type 2 diabetes mellitus without complications: Secondary | ICD-10-CM | POA: Diagnosis not present

## 2017-08-06 DIAGNOSIS — Z7984 Long term (current) use of oral hypoglycemic drugs: Secondary | ICD-10-CM | POA: Insufficient documentation

## 2017-08-06 DIAGNOSIS — I10 Essential (primary) hypertension: Secondary | ICD-10-CM | POA: Insufficient documentation

## 2017-08-06 DIAGNOSIS — H2512 Age-related nuclear cataract, left eye: Secondary | ICD-10-CM | POA: Diagnosis not present

## 2017-08-06 DIAGNOSIS — E1151 Type 2 diabetes mellitus with diabetic peripheral angiopathy without gangrene: Secondary | ICD-10-CM | POA: Insufficient documentation

## 2017-08-06 DIAGNOSIS — E039 Hypothyroidism, unspecified: Secondary | ICD-10-CM | POA: Insufficient documentation

## 2017-08-06 HISTORY — PX: CATARACT EXTRACTION W/PHACO: SHX586

## 2017-08-06 LAB — GLUCOSE, CAPILLARY: GLUCOSE-CAPILLARY: 163 mg/dL — AB (ref 65–99)

## 2017-08-06 SURGERY — PHACOEMULSIFICATION, CATARACT, WITH IOL INSERTION
Anesthesia: Monitor Anesthesia Care | Site: Eye | Laterality: Left | Wound class: Clean

## 2017-08-06 MED ORDER — ARMC OPHTHALMIC DILATING DROPS
OPHTHALMIC | Status: AC
  Filled 2017-08-06: qty 0.4

## 2017-08-06 MED ORDER — SODIUM CHLORIDE 0.9 % IV SOLN
INTRAVENOUS | Status: DC
  Administered 2017-08-06: 08:00:00 via INTRAVENOUS

## 2017-08-06 MED ORDER — CARBACHOL 0.01 % IO SOLN
INTRAOCULAR | Status: DC | PRN
  Administered 2017-08-06: .5 mL via INTRAOCULAR

## 2017-08-06 MED ORDER — NA CHONDROIT SULF-NA HYALURON 40-17 MG/ML IO SOLN
INTRAOCULAR | Status: DC | PRN
  Administered 2017-08-06: 1 mL via INTRAOCULAR

## 2017-08-06 MED ORDER — POVIDONE-IODINE 5 % OP SOLN
OPHTHALMIC | Status: DC | PRN
  Administered 2017-08-06: 1 via OPHTHALMIC

## 2017-08-06 MED ORDER — MOXIFLOXACIN HCL 0.5 % OP SOLN
OPHTHALMIC | Status: DC | PRN
  Administered 2017-08-06: .2 mL via OPHTHALMIC

## 2017-08-06 MED ORDER — LIDOCAINE HCL (PF) 4 % IJ SOLN
INTRAOCULAR | Status: DC | PRN
  Administered 2017-08-06: 2 mL via OPHTHALMIC

## 2017-08-06 MED ORDER — ARMC OPHTHALMIC DILATING DROPS
1.0000 "application " | OPHTHALMIC | Status: AC
  Administered 2017-08-06 (×3): 1 via OPHTHALMIC

## 2017-08-06 MED ORDER — MOXIFLOXACIN HCL 0.5 % OP SOLN: 1.0000 [drp] | OPHTHALMIC | Status: DC | PRN

## 2017-08-06 MED ORDER — EPINEPHRINE PF 1 MG/ML IJ SOLN
INTRAOCULAR | Status: DC | PRN
  Administered 2017-08-06: 1 mL via OPHTHALMIC

## 2017-08-06 MED ORDER — MIDAZOLAM HCL 2 MG/2ML IJ SOLN
INTRAMUSCULAR | Status: AC
  Filled 2017-08-06: qty 2

## 2017-08-06 MED ORDER — MIDAZOLAM HCL 2 MG/2ML IJ SOLN
INTRAMUSCULAR | Status: DC | PRN
  Administered 2017-08-06: 1 mg via INTRAVENOUS

## 2017-08-06 MED ORDER — MOXIFLOXACIN HCL 0.5 % OP SOLN
OPHTHALMIC | Status: AC
  Filled 2017-08-06: qty 3

## 2017-08-06 SURGICAL SUPPLY — 16 items
GLOVE BIO SURGEON STRL SZ8 (GLOVE) ×3 IMPLANT
GLOVE BIOGEL M 6.5 STRL (GLOVE) ×3 IMPLANT
GLOVE SURG LX 8.0 MICRO (GLOVE) ×2
GLOVE SURG LX STRL 8.0 MICRO (GLOVE) ×1 IMPLANT
GOWN STRL REUS W/ TWL LRG LVL3 (GOWN DISPOSABLE) ×2 IMPLANT
GOWN STRL REUS W/TWL LRG LVL3 (GOWN DISPOSABLE) ×4
LABEL CATARACT MEDS ST (LABEL) ×3 IMPLANT
LENS IOL TECNIS ITEC 22.0 (Intraocular Lens) ×3 IMPLANT
PACK CATARACT (MISCELLANEOUS) ×3 IMPLANT
PACK CATARACT BRASINGTON LX (MISCELLANEOUS) ×3 IMPLANT
PACK EYE AFTER SURG (MISCELLANEOUS) ×3 IMPLANT
SOL BSS BAG (MISCELLANEOUS) ×3
SOLUTION BSS BAG (MISCELLANEOUS) ×1 IMPLANT
SYR 5ML LL (SYRINGE) ×3 IMPLANT
WATER STERILE IRR 250ML POUR (IV SOLUTION) ×3 IMPLANT
WIPE NON LINTING 3.25X3.25 (MISCELLANEOUS) ×3 IMPLANT

## 2017-08-06 NOTE — Anesthesia Preprocedure Evaluation (Signed)
Anesthesia Evaluation  Patient identified by MRN, date of birth, ID band Patient awake    Reviewed: Allergy & Precautions, H&P , NPO status , Patient's Chart, lab work & pertinent test results, reviewed documented beta blocker date and time   History of Anesthesia Complications Negative for: history of anesthetic complications  Airway Mallampati: II  TM Distance: >3 FB Neck ROM: full    Dental  (+) Partial Upper, Partial Lower, Dental Advidsory Given   Pulmonary neg pulmonary ROS,           Cardiovascular Exercise Tolerance: Good hypertension, (-) angina+ Peripheral Vascular Disease  (-) CAD, (-) Past MI, (-) Cardiac Stents and (-) CABG (-) dysrhythmias (-) Valvular Problems/Murmurs     Neuro/Psych neg Seizures CVA, No Residual Symptoms negative psych ROS   GI/Hepatic negative GI ROS, Neg liver ROS,   Endo/Other  diabetesHypothyroidism   Renal/GU negative Renal ROS  negative genitourinary   Musculoskeletal   Abdominal   Peds  Hematology negative hematology ROS (+)   Anesthesia Other Findings Past Medical History: No date: Arthritis No date: Diabetes (HCC) No date: Hypercholesterolemia No date: Hypertension No date: Hypothyroidism (acquired) No date: Middle cerebral artery stenosis     Comment:  bilateral No date: Stroke ([place])   Reproductive/Obstetrics negative OB ROS                             Anesthesia Physical  Anesthesia Plan  ASA: III  Anesthesia Plan: MAC   Post-op Pain Management:    Induction:   PONV Risk Score and Plan: 2 and Ondansetron and Dexamethasone  Airway Management Planned:   Additional Equipment:   Intra-op Plan:   Post-operative Plan:   Informed Consent: I have reviewed the patients History and Physical, chart, labs and discussed the procedure including the risks, benefits and alternatives for the proposed anesthesia with the patient or authorized  representative who has indicated his/her understanding and acceptance.   Dental Advisory Given  Plan Discussed with: Anesthesiologist, CRNA and Surgeon  Anesthesia Plan Comments:         Anesthesia Quick Evaluation

## 2017-08-06 NOTE — Anesthesia Post-op Follow-up Note (Signed)
Anesthesia QCDR form completed.        

## 2017-08-06 NOTE — Anesthesia Postprocedure Evaluation (Signed)
Anesthesia Post Note  Patient: [name]  Procedure(s) Performed: Procedure(s) (LRB): CATARACT EXTRACTION PHACO AND INTRAOCULAR LENS PLACEMENT (IOC) (Left)  Patient location during evaluation: Short Stay Anesthesia Type: MAC Level of consciousness: awake and alert Pain management: pain level controlled Vital Signs Assessment: post-procedure vital signs reviewed and stable Respiratory status: spontaneous breathing, nonlabored ventilation, respiratory function stable and patient connected to nasal cannula oxygen Cardiovascular status: stable and blood pressure returned to baseline Anesthetic complications: no     Last Vitals:  Vitals:   08/06/17 0754 08/06/17 0908  BP: (!) 139/41 132/84  Pulse: 60 71  Resp: 16 12  Temp: 36.4 C 36.6 C    Last Pain:  Vitals:   08/06/17 0908  TempSrc: Oral                 Estill Batten

## 2017-08-06 NOTE — H&P (Signed)
All labs reviewed. Abnormal studies sent to patients PCP when indicated.  Previous H&P reviewed, patient examined, there are NO CHANGES.  [name] LOUIS8/7/20188:14 AM

## 2017-08-06 NOTE — Transfer of Care (Signed)
Immediate Anesthesia Transfer of Care Note  Patient: [name]  Procedure(s) Performed: Procedure(s) with comments: CATARACT EXTRACTION PHACO AND INTRAOCULAR LENS PLACEMENT (IOC) (Left) - [place]  00:35 AP%  19.2 CDE 6.77 Fluid pack lot # [id number]  Patient Location: Short Stay  Anesthesia Type:MAC  Level of Consciousness: awake, alert  and oriented  Airway & Oxygen Therapy: Patient Spontanous Breathing  Post-op Assessment: Post -op Vital signs reviewed and stable  Post vital signs: stable  Last Vitals:  Vitals:   08/06/17 0754 08/06/17 0908  BP: (!) 139/41 132/84  Pulse: 60 71  Resp: 16 12  Temp: 36.4 C 36.6 C    Last Pain:  Vitals:   08/06/17 0908  TempSrc: Oral         Complications: No apparent anesthesia complications

## 2017-08-06 NOTE — Discharge Instructions (Signed)
Eye Surgery Discharge Instructions  Expect mild scratchy sensation or mild soreness. DO NOT RUB YOUR EYE!  The day of surgery:  Minimal physical activity, but bed rest is not required  No reading, computer work, or close hand work  No bending, lifting, or straining.  May watch TV  For 24 hours:  No driving, legal decisions, or alcoholic beverages  Safety precautions  Eat anything you prefer: It is better to start with liquids, then soup then solid foods.  _____ Eye patch should be worn until postoperative exam tomorrow.  ____ Solar shield eyeglasses should be worn for comfort in the sunlight/patch while sleeping  Resume all regular medications including aspirin or Coumadin if these were discontinued prior to surgery. You may shower, bathe, shave, or wash your hair. Tylenol may be taken for mild discomfort.  Call your doctor if you experience significant pain, nausea, or vomiting, fever > 101 or other signs of infection. [phone] or [phone] Specific instructions:  Follow-up Information    Birder Robson, MD Follow up.   Specialty:  Ophthalmology Why:  August 8 at 10:40am Contact information: [address] [place] [address] [phone]

## 2017-08-06 NOTE — Op Note (Signed)
PREOPERATIVE DIAGNOSIS:  Nuclear sclerotic cataract of the left eye.   POSTOPERATIVE DIAGNOSIS:  Nuclear sclerotic cataract of the left eye.   OPERATIVE PROCEDURE: Procedure(s): CATARACT EXTRACTION PHACO AND INTRAOCULAR LENS PLACEMENT (IOC)   SURGEON:  Birder Robson, MD.   ANESTHESIA:  Anesthesiologist: Piscitello, Precious Haws, MD CRNA: Aline Brochure, CRNA  1.      Managed anesthesia care. 2.     0.31ml of Shugarcaine was instilled following the paracentesis   COMPLICATIONS:  None.   TECHNIQUE:   Stop and chop   DESCRIPTION OF PROCEDURE:  The patient was examined and consented in the preoperative holding area where the aforementioned topical anesthesia was applied to the left eye and then brought back to the Operating Room where the left eye was prepped and draped in the usual sterile ophthalmic fashion and a lid speculum was placed. A paracentesis was created with the side port blade and the anterior chamber was filled with viscoelastic. A near clear corneal incision was performed with the steel keratome. A continuous curvilinear capsulorrhexis was performed with a cystotome followed by the capsulorrhexis forceps. Hydrodissection and hydrodelineation were carried out with BSS on a blunt cannula. The lens was removed in a stop and chop  technique and the remaining cortical material was removed with the irrigation-aspiration handpiece. The capsular bag was inflated with viscoelastic and the Technis [id number] lens was placed in the capsular bag without complication. The remaining viscoelastic was removed from the eye with the irrigation-aspiration handpiece. The wounds were hydrated. The anterior chamber was flushed with Miostat and the eye was inflated to physiologic pressure. 0.45ml Vigamox was placed in the anterior chamber. The wounds were found to be water tight. The eye was dressed with Vigamox. The patient was given protective glasses to wear throughout the day and a shield with which to  sleep tonight. The patient was also given drops with which to begin a drop regimen today and will follow-up with me in one day.  Implant Name Type Inv. Item Serial No. Manufacturer Lot No. LRB No. Used  LENS IOL DIOP 22.0 - [id number] Intraocular Lens LENS IOL DIOP 22.0 [id number]   Left 1    Procedure(s) with comments: CATARACT EXTRACTION PHACO AND INTRAOCULAR LENS PLACEMENT (IOC) (Left) - [place]  00:35 AP%  19.2 CDE 6.77 Fluid pack lot # [id number]  Electronically signed: Eilee Schader LOUIS 08/06/2017 9:05 AM

## 2017-09-09 DIAGNOSIS — Z23 Encounter for immunization: Secondary | ICD-10-CM | POA: Diagnosis not present

## 2017-10-01 DIAGNOSIS — I679 Cerebrovascular disease, unspecified: Secondary | ICD-10-CM | POA: Diagnosis not present

## 2017-10-01 DIAGNOSIS — N3281 Overactive bladder: Secondary | ICD-10-CM | POA: Diagnosis not present

## 2017-10-01 DIAGNOSIS — F0280 Dementia in other diseases classified elsewhere without behavioral disturbance: Secondary | ICD-10-CM | POA: Diagnosis not present

## 2017-10-01 DIAGNOSIS — E119 Type 2 diabetes mellitus without complications: Secondary | ICD-10-CM | POA: Diagnosis not present

## 2017-10-01 DIAGNOSIS — I10 Essential (primary) hypertension: Secondary | ICD-10-CM | POA: Diagnosis not present

## 2017-10-01 DIAGNOSIS — E039 Hypothyroidism, unspecified: Secondary | ICD-10-CM | POA: Diagnosis not present

## 2017-10-01 DIAGNOSIS — E7800 Pure hypercholesterolemia, unspecified: Secondary | ICD-10-CM | POA: Diagnosis not present

## 2017-10-26 DIAGNOSIS — R42 Dizziness and giddiness: Secondary | ICD-10-CM | POA: Diagnosis not present

## 2017-11-05 DIAGNOSIS — C4441 Basal cell carcinoma of skin of scalp and neck: Secondary | ICD-10-CM | POA: Diagnosis not present

## 2017-11-05 DIAGNOSIS — L820 Inflamed seborrheic keratosis: Secondary | ICD-10-CM | POA: Diagnosis not present

## 2017-11-05 DIAGNOSIS — D2239 Melanocytic nevi of other parts of face: Secondary | ICD-10-CM | POA: Diagnosis not present

## 2017-11-05 DIAGNOSIS — L821 Other seborrheic keratosis: Secondary | ICD-10-CM | POA: Diagnosis not present

## 2017-11-05 DIAGNOSIS — D225 Melanocytic nevi of trunk: Secondary | ICD-10-CM | POA: Diagnosis not present

## 2017-12-16 ENCOUNTER — Ambulatory Visit (INDEPENDENT_AMBULATORY_CARE_PROVIDER_SITE_OTHER): Payer: Medicare Other | Admitting: Diagnostic Neuroimaging

## 2017-12-16 ENCOUNTER — Encounter: Payer: Self-pay | Admitting: Diagnostic Neuroimaging

## 2017-12-16 VITALS — BP 153/61 | HR 77 | Wt 114.6 lb

## 2017-12-16 DIAGNOSIS — F0390 Unspecified dementia without behavioral disturbance: Secondary | ICD-10-CM

## 2017-12-16 DIAGNOSIS — F03B0 Unspecified dementia, moderate, without behavioral disturbance, psychotic disturbance, mood disturbance, and anxiety: Secondary | ICD-10-CM
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
## 2017-12-27 DIAGNOSIS — H04223 Epiphora due to insufficient drainage, bilateral lacrimal glands: Secondary | ICD-10-CM | POA: Diagnosis not present

## 2018-01-01 DIAGNOSIS — F0280 Dementia in other diseases classified elsewhere without behavioral disturbance: Secondary | ICD-10-CM | POA: Diagnosis not present

## 2018-01-01 DIAGNOSIS — I10 Essential (primary) hypertension: Secondary | ICD-10-CM | POA: Diagnosis not present

## 2018-01-01 DIAGNOSIS — E7800 Pure hypercholesterolemia, unspecified: Secondary | ICD-10-CM | POA: Diagnosis not present

## 2018-01-01 DIAGNOSIS — E039 Hypothyroidism, unspecified: Secondary | ICD-10-CM | POA: Diagnosis not present

## 2018-01-01 DIAGNOSIS — E119 Type 2 diabetes mellitus without complications: Secondary | ICD-10-CM | POA: Diagnosis not present

## 2018-01-01 DIAGNOSIS — I679 Cerebrovascular disease, unspecified: Secondary | ICD-10-CM | POA: Diagnosis not present

## 2018-01-01 DIAGNOSIS — N3281 Overactive bladder: Secondary | ICD-10-CM | POA: Diagnosis not present

## 2018-03-10 DIAGNOSIS — H04223 Epiphora due to insufficient drainage, bilateral lacrimal glands: Secondary | ICD-10-CM | POA: Diagnosis not present

## 2018-04-07 DIAGNOSIS — H04223 Epiphora due to insufficient drainage, bilateral lacrimal glands: Secondary | ICD-10-CM | POA: Diagnosis not present

## 2018-05-23 DIAGNOSIS — F0280 Dementia in other diseases classified elsewhere without behavioral disturbance: Secondary | ICD-10-CM | POA: Diagnosis not present

## 2018-05-23 DIAGNOSIS — I10 Essential (primary) hypertension: Secondary | ICD-10-CM | POA: Diagnosis not present

## 2018-05-23 DIAGNOSIS — E1165 Type 2 diabetes mellitus with hyperglycemia: Secondary | ICD-10-CM | POA: Diagnosis not present

## 2018-05-23 DIAGNOSIS — E7800 Pure hypercholesterolemia, unspecified: Secondary | ICD-10-CM | POA: Diagnosis not present

## 2018-05-23 DIAGNOSIS — E119 Type 2 diabetes mellitus without complications: Secondary | ICD-10-CM | POA: Diagnosis not present

## 2018-05-23 DIAGNOSIS — Z7984 Long term (current) use of oral hypoglycemic drugs: Secondary | ICD-10-CM | POA: Diagnosis not present

## 2018-05-23 DIAGNOSIS — I679 Cerebrovascular disease, unspecified: Secondary | ICD-10-CM | POA: Diagnosis not present

## 2018-05-23 DIAGNOSIS — E039 Hypothyroidism, unspecified: Secondary | ICD-10-CM | POA: Diagnosis not present

## 2018-08-13 DIAGNOSIS — J069 Acute upper respiratory infection, unspecified: Secondary | ICD-10-CM | POA: Diagnosis not present

## 2018-09-08 DIAGNOSIS — Z23 Encounter for immunization: Secondary | ICD-10-CM | POA: Diagnosis not present

## 2018-09-26 DIAGNOSIS — H353131 Nonexudative age-related macular degeneration, bilateral, early dry stage: Secondary | ICD-10-CM | POA: Diagnosis not present

## 2018-11-06 DIAGNOSIS — Z23 Encounter for immunization: Secondary | ICD-10-CM | POA: Diagnosis not present

## 2018-11-06 DIAGNOSIS — Z7982 Long term (current) use of aspirin: Secondary | ICD-10-CM | POA: Diagnosis not present

## 2018-11-06 DIAGNOSIS — S0083XA Contusion of other part of head, initial encounter: Secondary | ICD-10-CM | POA: Diagnosis not present

## 2018-11-06 DIAGNOSIS — E039 Hypothyroidism, unspecified: Secondary | ICD-10-CM | POA: Diagnosis not present

## 2018-11-06 DIAGNOSIS — Z7984 Long term (current) use of oral hypoglycemic drugs: Secondary | ICD-10-CM | POA: Diagnosis not present

## 2018-11-06 DIAGNOSIS — Z8673 Personal history of transient ischemic attack (TIA), and cerebral infarction without residual deficits: Secondary | ICD-10-CM | POA: Diagnosis not present

## 2018-11-06 DIAGNOSIS — M79642 Pain in left hand: Secondary | ICD-10-CM | POA: Diagnosis not present

## 2018-11-06 DIAGNOSIS — F0390 Unspecified dementia without behavioral disturbance: Secondary | ICD-10-CM | POA: Diagnosis not present

## 2018-11-06 DIAGNOSIS — S6992XA Unspecified injury of left wrist, hand and finger(s), initial encounter: Secondary | ICD-10-CM | POA: Diagnosis not present

## 2018-11-06 DIAGNOSIS — I10 Essential (primary) hypertension: Secondary | ICD-10-CM | POA: Diagnosis not present

## 2018-11-06 DIAGNOSIS — S0990XA Unspecified injury of head, initial encounter: Secondary | ICD-10-CM | POA: Diagnosis not present

## 2018-11-06 DIAGNOSIS — Z79899 Other long term (current) drug therapy: Secondary | ICD-10-CM | POA: Diagnosis not present

## 2018-11-17 DIAGNOSIS — M79642 Pain in left hand: Secondary | ICD-10-CM | POA: Diagnosis not present

## 2018-11-17 DIAGNOSIS — W19XXXA Unspecified fall, initial encounter: Secondary | ICD-10-CM | POA: Diagnosis not present

## 2018-11-17 DIAGNOSIS — S60222A Contusion of left hand, initial encounter: Secondary | ICD-10-CM | POA: Diagnosis not present

## 2018-12-11 DIAGNOSIS — G301 Alzheimer's disease with late onset: Secondary | ICD-10-CM | POA: Diagnosis not present

## 2018-12-11 DIAGNOSIS — E119 Type 2 diabetes mellitus without complications: Secondary | ICD-10-CM | POA: Diagnosis not present

## 2018-12-11 DIAGNOSIS — I10 Essential (primary) hypertension: Secondary | ICD-10-CM | POA: Diagnosis not present

## 2018-12-11 DIAGNOSIS — Z7984 Long term (current) use of oral hypoglycemic drugs: Secondary | ICD-10-CM | POA: Diagnosis not present

## 2018-12-11 DIAGNOSIS — I679 Cerebrovascular disease, unspecified: Secondary | ICD-10-CM | POA: Diagnosis not present

## 2018-12-11 DIAGNOSIS — E039 Hypothyroidism, unspecified: Secondary | ICD-10-CM | POA: Diagnosis not present

## 2018-12-11 DIAGNOSIS — E7800 Pure hypercholesterolemia, unspecified: Secondary | ICD-10-CM | POA: Diagnosis not present

## 2019-05-05 ENCOUNTER — Telehealth: Payer: Self-pay

## 2019-05-05 NOTE — Telephone Encounter (Signed)
Unable to get in contact with the patient to offer her a doxy.me follow up with Amy. I left a voicemail asking the patient to return my call. Office number was provided.

## 2019-08-13 DIAGNOSIS — E119 Type 2 diabetes mellitus without complications: Secondary | ICD-10-CM | POA: Diagnosis not present

## 2019-08-13 DIAGNOSIS — Z0000 Encounter for general adult medical examination without abnormal findings: Secondary | ICD-10-CM | POA: Diagnosis not present

## 2019-08-13 DIAGNOSIS — G301 Alzheimer's disease with late onset: Secondary | ICD-10-CM | POA: Diagnosis not present

## 2019-08-13 DIAGNOSIS — N3281 Overactive bladder: Secondary | ICD-10-CM | POA: Diagnosis not present

## 2019-08-13 DIAGNOSIS — I679 Cerebrovascular disease, unspecified: Secondary | ICD-10-CM | POA: Diagnosis not present

## 2019-08-13 DIAGNOSIS — E039 Hypothyroidism, unspecified: Secondary | ICD-10-CM | POA: Diagnosis not present

## 2019-08-13 DIAGNOSIS — E7800 Pure hypercholesterolemia, unspecified: Secondary | ICD-10-CM | POA: Diagnosis not present

## 2019-08-13 DIAGNOSIS — I10 Essential (primary) hypertension: Secondary | ICD-10-CM | POA: Diagnosis not present

## 2019-08-27 DIAGNOSIS — Z23 Encounter for immunization: Secondary | ICD-10-CM | POA: Diagnosis not present

## 2019-08-31 DIAGNOSIS — I10 Essential (primary) hypertension: Secondary | ICD-10-CM | POA: Diagnosis not present

## 2019-08-31 DIAGNOSIS — E119 Type 2 diabetes mellitus without complications: Secondary | ICD-10-CM | POA: Diagnosis not present

## 2019-08-31 DIAGNOSIS — E7800 Pure hypercholesterolemia, unspecified: Secondary | ICD-10-CM | POA: Diagnosis not present

## 2019-09-23 DIAGNOSIS — H353131 Nonexudative age-related macular degeneration, bilateral, early dry stage: Secondary | ICD-10-CM | POA: Diagnosis not present

## 2019-11-11 DIAGNOSIS — G301 Alzheimer's disease with late onset: Secondary | ICD-10-CM | POA: Diagnosis not present

## 2019-11-11 DIAGNOSIS — I10 Essential (primary) hypertension: Secondary | ICD-10-CM | POA: Diagnosis not present

## 2019-11-11 DIAGNOSIS — E7800 Pure hypercholesterolemia, unspecified: Secondary | ICD-10-CM | POA: Diagnosis not present

## 2019-11-11 DIAGNOSIS — E039 Hypothyroidism, unspecified: Secondary | ICD-10-CM | POA: Diagnosis not present

## 2019-11-11 DIAGNOSIS — E119 Type 2 diabetes mellitus without complications: Secondary | ICD-10-CM | POA: Diagnosis not present

## 2020-01-15 DIAGNOSIS — I10 Essential (primary) hypertension: Secondary | ICD-10-CM | POA: Diagnosis not present

## 2020-01-15 DIAGNOSIS — E119 Type 2 diabetes mellitus without complications: Secondary | ICD-10-CM | POA: Diagnosis not present

## 2020-01-15 DIAGNOSIS — E039 Hypothyroidism, unspecified: Secondary | ICD-10-CM | POA: Diagnosis not present

## 2020-01-15 DIAGNOSIS — G301 Alzheimer's disease with late onset: Secondary | ICD-10-CM | POA: Diagnosis not present

## 2020-01-15 DIAGNOSIS — E7800 Pure hypercholesterolemia, unspecified: Secondary | ICD-10-CM | POA: Diagnosis not present

## 2020-02-10 DIAGNOSIS — Z23 Encounter for immunization: Secondary | ICD-10-CM | POA: Diagnosis not present

## 2020-03-09 DIAGNOSIS — Z23 Encounter for immunization: Secondary | ICD-10-CM | POA: Diagnosis not present

## 2020-08-12 DIAGNOSIS — E119 Type 2 diabetes mellitus without complications: Secondary | ICD-10-CM | POA: Diagnosis not present

## 2020-08-12 DIAGNOSIS — I10 Essential (primary) hypertension: Secondary | ICD-10-CM | POA: Diagnosis not present

## 2020-08-12 DIAGNOSIS — G301 Alzheimer's disease with late onset: Secondary | ICD-10-CM | POA: Diagnosis not present

## 2020-08-12 DIAGNOSIS — E7800 Pure hypercholesterolemia, unspecified: Secondary | ICD-10-CM | POA: Diagnosis not present

## 2020-08-12 DIAGNOSIS — E039 Hypothyroidism, unspecified: Secondary | ICD-10-CM | POA: Diagnosis not present

## 2020-08-12 DIAGNOSIS — I679 Cerebrovascular disease, unspecified: Secondary | ICD-10-CM | POA: Diagnosis not present

## 2020-09-23 DIAGNOSIS — H35372 Puckering of macula, left eye: Secondary | ICD-10-CM | POA: Diagnosis not present

## 2020-10-17 DIAGNOSIS — Z23 Encounter for immunization: Secondary | ICD-10-CM | POA: Diagnosis not present

## 2020-12-07 DIAGNOSIS — M545 Low back pain, unspecified: Secondary | ICD-10-CM | POA: Diagnosis not present

## 2020-12-27 DIAGNOSIS — Z23 Encounter for immunization: Secondary | ICD-10-CM | POA: Diagnosis not present

## 2021-02-09 DIAGNOSIS — I679 Cerebrovascular disease, unspecified: Secondary | ICD-10-CM | POA: Diagnosis not present

## 2021-02-09 DIAGNOSIS — I10 Essential (primary) hypertension: Secondary | ICD-10-CM | POA: Diagnosis not present

## 2021-02-09 DIAGNOSIS — E039 Hypothyroidism, unspecified: Secondary | ICD-10-CM | POA: Diagnosis not present

## 2021-02-09 DIAGNOSIS — E7800 Pure hypercholesterolemia, unspecified: Secondary | ICD-10-CM | POA: Diagnosis not present

## 2021-02-09 DIAGNOSIS — R946 Abnormal results of thyroid function studies: Secondary | ICD-10-CM | POA: Diagnosis not present

## 2021-02-09 DIAGNOSIS — E119 Type 2 diabetes mellitus without complications: Secondary | ICD-10-CM | POA: Diagnosis not present

## 2021-02-09 DIAGNOSIS — G301 Alzheimer's disease with late onset: Secondary | ICD-10-CM | POA: Diagnosis not present

## 2021-02-27 DIAGNOSIS — H353131 Nonexudative age-related macular degeneration, bilateral, early dry stage: Secondary | ICD-10-CM | POA: Diagnosis not present

## 2021-02-27 DIAGNOSIS — H26492 Other secondary cataract, left eye: Secondary | ICD-10-CM | POA: Diagnosis not present

## 2021-03-13 DIAGNOSIS — H353122 Nonexudative age-related macular degeneration, left eye, intermediate dry stage: Secondary | ICD-10-CM | POA: Diagnosis not present

## 2021-03-13 DIAGNOSIS — H26491 Other secondary cataract, right eye: Secondary | ICD-10-CM | POA: Diagnosis not present

## 2021-03-16 DIAGNOSIS — H353221 Exudative age-related macular degeneration, left eye, with active choroidal neovascularization: Secondary | ICD-10-CM | POA: Diagnosis not present

## 2021-04-20 DIAGNOSIS — H353221 Exudative age-related macular degeneration, left eye, with active choroidal neovascularization: Secondary | ICD-10-CM | POA: Diagnosis not present

## 2021-05-18 DIAGNOSIS — H353221 Exudative age-related macular degeneration, left eye, with active choroidal neovascularization: Secondary | ICD-10-CM | POA: Diagnosis not present

## 2021-06-08 DIAGNOSIS — N390 Urinary tract infection, site not specified: Secondary | ICD-10-CM | POA: Diagnosis not present

## 2021-06-08 DIAGNOSIS — R350 Frequency of micturition: Secondary | ICD-10-CM | POA: Diagnosis not present

## 2021-06-26 DIAGNOSIS — H353221 Exudative age-related macular degeneration, left eye, with active choroidal neovascularization: Secondary | ICD-10-CM | POA: Diagnosis not present

## 2021-07-25 DIAGNOSIS — A4901 Methicillin susceptible Staphylococcus aureus infection, unspecified site: Secondary | ICD-10-CM | POA: Diagnosis not present

## 2021-07-25 DIAGNOSIS — N390 Urinary tract infection, site not specified: Secondary | ICD-10-CM | POA: Diagnosis not present

## 2021-08-03 DIAGNOSIS — R634 Abnormal weight loss: Secondary | ICD-10-CM | POA: Diagnosis not present

## 2021-08-03 DIAGNOSIS — E119 Type 2 diabetes mellitus without complications: Secondary | ICD-10-CM | POA: Diagnosis not present

## 2021-08-03 DIAGNOSIS — Z7409 Other reduced mobility: Secondary | ICD-10-CM | POA: Diagnosis not present

## 2021-08-03 DIAGNOSIS — E039 Hypothyroidism, unspecified: Secondary | ICD-10-CM | POA: Diagnosis not present

## 2021-08-03 DIAGNOSIS — I2510 Atherosclerotic heart disease of native coronary artery without angina pectoris: Secondary | ICD-10-CM | POA: Diagnosis not present

## 2021-08-03 DIAGNOSIS — G309 Alzheimer's disease, unspecified: Secondary | ICD-10-CM | POA: Diagnosis not present

## 2021-08-03 DIAGNOSIS — I10 Essential (primary) hypertension: Secondary | ICD-10-CM | POA: Diagnosis not present

## 2021-08-03 DIAGNOSIS — N390 Urinary tract infection, site not specified: Secondary | ICD-10-CM | POA: Diagnosis not present

## 2021-08-03 DIAGNOSIS — M159 Polyosteoarthritis, unspecified: Secondary | ICD-10-CM | POA: Diagnosis not present

## 2021-08-03 DIAGNOSIS — E785 Hyperlipidemia, unspecified: Secondary | ICD-10-CM | POA: Diagnosis not present

## 2021-08-08 DIAGNOSIS — D518 Other vitamin B12 deficiency anemias: Secondary | ICD-10-CM | POA: Diagnosis not present

## 2021-08-08 DIAGNOSIS — E038 Other specified hypothyroidism: Secondary | ICD-10-CM | POA: Diagnosis not present

## 2021-08-08 DIAGNOSIS — E7849 Other hyperlipidemia: Secondary | ICD-10-CM | POA: Diagnosis not present

## 2021-08-08 DIAGNOSIS — Z79899 Other long term (current) drug therapy: Secondary | ICD-10-CM | POA: Diagnosis not present

## 2021-08-08 DIAGNOSIS — E119 Type 2 diabetes mellitus without complications: Secondary | ICD-10-CM | POA: Diagnosis not present

## 2021-08-08 DIAGNOSIS — E559 Vitamin D deficiency, unspecified: Secondary | ICD-10-CM | POA: Diagnosis not present

## 2021-08-10 DIAGNOSIS — R630 Anorexia: Secondary | ICD-10-CM | POA: Diagnosis not present

## 2021-08-10 DIAGNOSIS — R627 Adult failure to thrive: Secondary | ICD-10-CM | POA: Diagnosis not present

## 2021-08-10 DIAGNOSIS — G301 Alzheimer's disease with late onset: Secondary | ICD-10-CM | POA: Diagnosis not present

## 2021-08-10 DIAGNOSIS — E870 Hyperosmolality and hypernatremia: Secondary | ICD-10-CM | POA: Diagnosis not present

## 2021-08-10 DIAGNOSIS — E039 Hypothyroidism, unspecified: Secondary | ICD-10-CM | POA: Diagnosis not present

## 2021-08-17 DIAGNOSIS — R630 Anorexia: Secondary | ICD-10-CM | POA: Diagnosis not present

## 2021-08-17 DIAGNOSIS — R627 Adult failure to thrive: Secondary | ICD-10-CM | POA: Diagnosis not present

## 2021-08-17 DIAGNOSIS — G309 Alzheimer's disease, unspecified: Secondary | ICD-10-CM | POA: Diagnosis not present

## 2021-08-17 DIAGNOSIS — R400 Somnolence: Secondary | ICD-10-CM | POA: Diagnosis not present

## 2021-08-18 DIAGNOSIS — N390 Urinary tract infection, site not specified: Secondary | ICD-10-CM | POA: Diagnosis not present

## 2021-08-24 DIAGNOSIS — G309 Alzheimer's disease, unspecified: Secondary | ICD-10-CM | POA: Diagnosis not present

## 2021-08-24 DIAGNOSIS — R627 Adult failure to thrive: Secondary | ICD-10-CM | POA: Diagnosis not present

## 2021-08-24 DIAGNOSIS — B372 Candidiasis of skin and nail: Secondary | ICD-10-CM | POA: Diagnosis not present

## 2021-09-27 DIAGNOSIS — I10 Essential (primary) hypertension: Secondary | ICD-10-CM | POA: Diagnosis present

## 2021-09-27 DIAGNOSIS — R6520 Severe sepsis without septic shock: Secondary | ICD-10-CM | POA: Diagnosis present

## 2021-09-27 DIAGNOSIS — K3189 Other diseases of stomach and duodenum: Secondary | ICD-10-CM | POA: Diagnosis not present

## 2021-09-27 DIAGNOSIS — Z741 Need for assistance with personal care: Secondary | ICD-10-CM | POA: Diagnosis not present

## 2021-09-27 DIAGNOSIS — E119 Type 2 diabetes mellitus without complications: Secondary | ICD-10-CM | POA: Diagnosis not present

## 2021-09-27 DIAGNOSIS — G9341 Metabolic encephalopathy: Secondary | ICD-10-CM | POA: Diagnosis present

## 2021-09-27 DIAGNOSIS — M1990 Unspecified osteoarthritis, unspecified site: Secondary | ICD-10-CM | POA: Diagnosis present

## 2021-09-27 DIAGNOSIS — E876 Hypokalemia: Secondary | ICD-10-CM | POA: Diagnosis present

## 2021-09-27 DIAGNOSIS — Z9049 Acquired absence of other specified parts of digestive tract: Secondary | ICD-10-CM | POA: Diagnosis not present

## 2021-09-27 DIAGNOSIS — R918 Other nonspecific abnormal finding of lung field: Secondary | ICD-10-CM | POA: Diagnosis not present

## 2021-09-27 DIAGNOSIS — Z66 Do not resuscitate: Secondary | ICD-10-CM | POA: Diagnosis not present

## 2021-09-27 DIAGNOSIS — R1311 Dysphagia, oral phase: Secondary | ICD-10-CM | POA: Diagnosis not present

## 2021-09-27 DIAGNOSIS — M6281 Muscle weakness (generalized): Secondary | ICD-10-CM | POA: Diagnosis not present

## 2021-09-27 DIAGNOSIS — R188 Other ascites: Secondary | ICD-10-CM | POA: Diagnosis not present

## 2021-09-27 DIAGNOSIS — E538 Deficiency of other specified B group vitamins: Secondary | ICD-10-CM | POA: Diagnosis present

## 2021-09-27 DIAGNOSIS — R0902 Hypoxemia: Secondary | ICD-10-CM | POA: Diagnosis not present

## 2021-09-27 DIAGNOSIS — I959 Hypotension, unspecified: Secondary | ICD-10-CM | POA: Diagnosis not present

## 2021-09-27 DIAGNOSIS — Z743 Need for continuous supervision: Secondary | ICD-10-CM | POA: Diagnosis not present

## 2021-09-27 DIAGNOSIS — J189 Pneumonia, unspecified organism: Secondary | ICD-10-CM | POA: Diagnosis not present

## 2021-09-27 DIAGNOSIS — E46 Unspecified protein-calorie malnutrition: Secondary | ICD-10-CM | POA: Diagnosis not present

## 2021-09-27 DIAGNOSIS — K254 Chronic or unspecified gastric ulcer with hemorrhage: Secondary | ICD-10-CM | POA: Diagnosis present

## 2021-09-27 DIAGNOSIS — Z79899 Other long term (current) drug therapy: Secondary | ICD-10-CM | POA: Diagnosis not present

## 2021-09-27 DIAGNOSIS — F0280 Dementia in other diseases classified elsewhere without behavioral disturbance: Secondary | ICD-10-CM | POA: Diagnosis present

## 2021-09-27 DIAGNOSIS — T68XXXA Hypothermia, initial encounter: Secondary | ICD-10-CM | POA: Diagnosis not present

## 2021-09-27 DIAGNOSIS — R404 Transient alteration of awareness: Secondary | ICD-10-CM | POA: Diagnosis not present

## 2021-09-27 DIAGNOSIS — R5381 Other malaise: Secondary | ICD-10-CM | POA: Diagnosis not present

## 2021-09-27 DIAGNOSIS — Z8673 Personal history of transient ischemic attack (TIA), and cerebral infarction without residual deficits: Secondary | ICD-10-CM | POA: Diagnosis not present

## 2021-09-27 DIAGNOSIS — R410 Disorientation, unspecified: Secondary | ICD-10-CM | POA: Diagnosis not present

## 2021-09-27 DIAGNOSIS — E860 Dehydration: Secondary | ICD-10-CM | POA: Diagnosis present

## 2021-09-27 DIAGNOSIS — A419 Sepsis, unspecified organism: Secondary | ICD-10-CM | POA: Diagnosis not present

## 2021-09-27 DIAGNOSIS — F0390 Unspecified dementia without behavioral disturbance: Secondary | ICD-10-CM | POA: Diagnosis not present

## 2021-09-27 DIAGNOSIS — E870 Hyperosmolality and hypernatremia: Secondary | ICD-10-CM | POA: Diagnosis present

## 2021-09-27 DIAGNOSIS — D62 Acute posthemorrhagic anemia: Secondary | ICD-10-CM | POA: Diagnosis present

## 2021-09-27 DIAGNOSIS — G309 Alzheimer's disease, unspecified: Secondary | ICD-10-CM | POA: Diagnosis present

## 2021-09-27 DIAGNOSIS — R7989 Other specified abnormal findings of blood chemistry: Secondary | ICD-10-CM | POA: Diagnosis present

## 2021-09-27 DIAGNOSIS — R739 Hyperglycemia, unspecified: Secondary | ICD-10-CM | POA: Diagnosis not present

## 2021-09-27 DIAGNOSIS — E1110 Type 2 diabetes mellitus with ketoacidosis without coma: Secondary | ICD-10-CM | POA: Diagnosis not present

## 2021-09-27 DIAGNOSIS — Z7984 Long term (current) use of oral hypoglycemic drugs: Secondary | ICD-10-CM | POA: Diagnosis not present

## 2021-09-27 DIAGNOSIS — K319 Disease of stomach and duodenum, unspecified: Secondary | ICD-10-CM | POA: Diagnosis not present

## 2021-09-27 DIAGNOSIS — E1165 Type 2 diabetes mellitus with hyperglycemia: Secondary | ICD-10-CM | POA: Diagnosis present

## 2021-09-27 DIAGNOSIS — R933 Abnormal findings on diagnostic imaging of other parts of digestive tract: Secondary | ICD-10-CM | POA: Diagnosis present

## 2021-09-27 DIAGNOSIS — J90 Pleural effusion, not elsewhere classified: Secondary | ICD-10-CM | POA: Diagnosis not present

## 2021-09-27 DIAGNOSIS — Z681 Body mass index (BMI) 19 or less, adult: Secondary | ICD-10-CM | POA: Diagnosis not present

## 2021-09-27 DIAGNOSIS — N289 Disorder of kidney and ureter, unspecified: Secondary | ICD-10-CM | POA: Diagnosis not present

## 2021-09-27 DIAGNOSIS — I6523 Occlusion and stenosis of bilateral carotid arteries: Secondary | ICD-10-CM | POA: Diagnosis not present

## 2021-09-27 DIAGNOSIS — J9811 Atelectasis: Secondary | ICD-10-CM | POA: Diagnosis not present

## 2021-09-27 DIAGNOSIS — R41841 Cognitive communication deficit: Secondary | ICD-10-CM | POA: Diagnosis not present

## 2021-09-27 DIAGNOSIS — R4182 Altered mental status, unspecified: Secondary | ICD-10-CM | POA: Diagnosis not present

## 2021-09-27 DIAGNOSIS — E43 Unspecified severe protein-calorie malnutrition: Secondary | ICD-10-CM | POA: Diagnosis not present

## 2021-09-27 DIAGNOSIS — Z7982 Long term (current) use of aspirin: Secondary | ICD-10-CM | POA: Diagnosis not present

## 2021-09-27 DIAGNOSIS — K259 Gastric ulcer, unspecified as acute or chronic, without hemorrhage or perforation: Secondary | ICD-10-CM | POA: Diagnosis not present

## 2021-09-27 DIAGNOSIS — R680 Hypothermia, not associated with low environmental temperature: Secondary | ICD-10-CM | POA: Diagnosis present

## 2021-10-03 DIAGNOSIS — I10 Essential (primary) hypertension: Secondary | ICD-10-CM | POA: Diagnosis not present

## 2021-10-03 DIAGNOSIS — F0390 Unspecified dementia without behavioral disturbance: Secondary | ICD-10-CM | POA: Diagnosis not present

## 2021-10-03 DIAGNOSIS — E119 Type 2 diabetes mellitus without complications: Secondary | ICD-10-CM | POA: Diagnosis not present

## 2021-10-03 DIAGNOSIS — K259 Gastric ulcer, unspecified as acute or chronic, without hemorrhage or perforation: Secondary | ICD-10-CM | POA: Diagnosis not present

## 2021-10-05 DIAGNOSIS — E119 Type 2 diabetes mellitus without complications: Secondary | ICD-10-CM | POA: Diagnosis not present

## 2021-10-05 DIAGNOSIS — Z87440 Personal history of urinary (tract) infections: Secondary | ICD-10-CM | POA: Diagnosis not present

## 2021-10-05 DIAGNOSIS — R630 Anorexia: Secondary | ICD-10-CM | POA: Diagnosis not present

## 2021-10-05 DIAGNOSIS — R64 Cachexia: Secondary | ICD-10-CM | POA: Diagnosis not present

## 2021-10-05 DIAGNOSIS — Z743 Need for continuous supervision: Secondary | ICD-10-CM | POA: Diagnosis not present

## 2021-10-05 DIAGNOSIS — E43 Unspecified severe protein-calorie malnutrition: Secondary | ICD-10-CM | POA: Diagnosis not present

## 2021-10-05 DIAGNOSIS — R627 Adult failure to thrive: Secondary | ICD-10-CM | POA: Diagnosis not present

## 2021-10-05 DIAGNOSIS — N289 Disorder of kidney and ureter, unspecified: Secondary | ICD-10-CM | POA: Diagnosis not present

## 2021-10-05 DIAGNOSIS — E1110 Type 2 diabetes mellitus with ketoacidosis without coma: Secondary | ICD-10-CM | POA: Diagnosis not present

## 2021-10-05 DIAGNOSIS — G309 Alzheimer's disease, unspecified: Secondary | ICD-10-CM | POA: Diagnosis not present

## 2021-10-05 DIAGNOSIS — F0390 Unspecified dementia without behavioral disturbance: Secondary | ICD-10-CM | POA: Diagnosis not present

## 2021-10-05 DIAGNOSIS — G311 Senile degeneration of brain, not elsewhere classified: Secondary | ICD-10-CM | POA: Diagnosis not present

## 2021-10-05 DIAGNOSIS — M6281 Muscle weakness (generalized): Secondary | ICD-10-CM | POA: Diagnosis not present

## 2021-10-05 DIAGNOSIS — R1311 Dysphagia, oral phase: Secondary | ICD-10-CM | POA: Diagnosis not present

## 2021-10-05 DIAGNOSIS — Z515 Encounter for palliative care: Secondary | ICD-10-CM | POA: Diagnosis not present

## 2021-10-05 DIAGNOSIS — R41841 Cognitive communication deficit: Secondary | ICD-10-CM | POA: Diagnosis not present

## 2021-10-05 DIAGNOSIS — Z8673 Personal history of transient ischemic attack (TIA), and cerebral infarction without residual deficits: Secondary | ICD-10-CM | POA: Diagnosis not present

## 2021-10-05 DIAGNOSIS — E46 Unspecified protein-calorie malnutrition: Secondary | ICD-10-CM | POA: Diagnosis not present

## 2021-10-05 DIAGNOSIS — K259 Gastric ulcer, unspecified as acute or chronic, without hemorrhage or perforation: Secondary | ICD-10-CM | POA: Diagnosis not present

## 2021-10-05 DIAGNOSIS — Z741 Need for assistance with personal care: Secondary | ICD-10-CM | POA: Diagnosis not present

## 2021-10-05 DIAGNOSIS — G9341 Metabolic encephalopathy: Secondary | ICD-10-CM | POA: Diagnosis not present

## 2021-10-05 DIAGNOSIS — R4182 Altered mental status, unspecified: Secondary | ICD-10-CM | POA: Diagnosis not present

## 2021-10-05 DIAGNOSIS — A419 Sepsis, unspecified organism: Secondary | ICD-10-CM | POA: Diagnosis not present

## 2021-10-05 DIAGNOSIS — F02B0 Dementia in other diseases classified elsewhere, moderate, without behavioral disturbance, psychotic disturbance, mood disturbance, and anxiety: Secondary | ICD-10-CM | POA: Diagnosis not present

## 2021-10-05 DIAGNOSIS — R5381 Other malaise: Secondary | ICD-10-CM | POA: Diagnosis not present

## 2021-10-05 DIAGNOSIS — I10 Essential (primary) hypertension: Secondary | ICD-10-CM | POA: Diagnosis not present

## 2021-10-09 DIAGNOSIS — E43 Unspecified severe protein-calorie malnutrition: Secondary | ICD-10-CM | POA: Diagnosis not present

## 2021-10-09 DIAGNOSIS — E119 Type 2 diabetes mellitus without complications: Secondary | ICD-10-CM | POA: Diagnosis not present

## 2021-10-09 DIAGNOSIS — R64 Cachexia: Secondary | ICD-10-CM | POA: Diagnosis not present

## 2021-10-09 DIAGNOSIS — R5381 Other malaise: Secondary | ICD-10-CM | POA: Diagnosis not present

## 2021-10-18 DIAGNOSIS — R64 Cachexia: Secondary | ICD-10-CM | POA: Diagnosis not present

## 2021-10-18 DIAGNOSIS — F0390 Unspecified dementia without behavioral disturbance: Secondary | ICD-10-CM | POA: Diagnosis not present

## 2021-10-18 DIAGNOSIS — E119 Type 2 diabetes mellitus without complications: Secondary | ICD-10-CM | POA: Diagnosis not present

## 2021-10-18 DIAGNOSIS — R5381 Other malaise: Secondary | ICD-10-CM | POA: Diagnosis not present

## 2021-10-19 DIAGNOSIS — F02B0 Dementia in other diseases classified elsewhere, moderate, without behavioral disturbance, psychotic disturbance, mood disturbance, and anxiety: Secondary | ICD-10-CM | POA: Diagnosis not present

## 2021-10-19 DIAGNOSIS — G311 Senile degeneration of brain, not elsewhere classified: Secondary | ICD-10-CM | POA: Diagnosis not present

## 2021-10-19 DIAGNOSIS — Z515 Encounter for palliative care: Secondary | ICD-10-CM | POA: Diagnosis not present

## 2021-10-19 DIAGNOSIS — Z87440 Personal history of urinary (tract) infections: Secondary | ICD-10-CM | POA: Diagnosis not present

## 2021-10-19 DIAGNOSIS — E119 Type 2 diabetes mellitus without complications: Secondary | ICD-10-CM | POA: Diagnosis not present

## 2021-10-19 DIAGNOSIS — Z8673 Personal history of transient ischemic attack (TIA), and cerebral infarction without residual deficits: Secondary | ICD-10-CM | POA: Diagnosis not present

## 2021-10-25 DIAGNOSIS — R5381 Other malaise: Secondary | ICD-10-CM | POA: Diagnosis not present

## 2021-10-25 DIAGNOSIS — E119 Type 2 diabetes mellitus without complications: Secondary | ICD-10-CM | POA: Diagnosis not present

## 2021-10-25 DIAGNOSIS — F0390 Unspecified dementia without behavioral disturbance: Secondary | ICD-10-CM | POA: Diagnosis not present

## 2021-10-25 DIAGNOSIS — K259 Gastric ulcer, unspecified as acute or chronic, without hemorrhage or perforation: Secondary | ICD-10-CM | POA: Diagnosis not present

## 2021-10-26 DIAGNOSIS — R630 Anorexia: Secondary | ICD-10-CM | POA: Diagnosis not present

## 2021-10-26 DIAGNOSIS — R627 Adult failure to thrive: Secondary | ICD-10-CM | POA: Diagnosis not present

## 2021-10-26 DIAGNOSIS — R5381 Other malaise: Secondary | ICD-10-CM | POA: Diagnosis not present

## 2021-10-26 DIAGNOSIS — G309 Alzheimer's disease, unspecified: Secondary | ICD-10-CM | POA: Diagnosis not present

## 2021-10-26 DIAGNOSIS — M6281 Muscle weakness (generalized): Secondary | ICD-10-CM | POA: Diagnosis not present

## 2021-10-26 DIAGNOSIS — I10 Essential (primary) hypertension: Secondary | ICD-10-CM | POA: Diagnosis not present

## 2021-10-27 DIAGNOSIS — A419 Sepsis, unspecified organism: Secondary | ICD-10-CM | POA: Diagnosis not present

## 2021-10-27 DIAGNOSIS — N289 Disorder of kidney and ureter, unspecified: Secondary | ICD-10-CM | POA: Diagnosis not present

## 2021-10-27 DIAGNOSIS — D649 Anemia, unspecified: Secondary | ICD-10-CM | POA: Diagnosis not present

## 2021-10-27 DIAGNOSIS — E1100 Type 2 diabetes mellitus with hyperosmolarity without nonketotic hyperglycemic-hyperosmolar coma (NKHHC): Secondary | ICD-10-CM | POA: Diagnosis not present

## 2021-10-27 DIAGNOSIS — G9341 Metabolic encephalopathy: Secondary | ICD-10-CM | POA: Diagnosis not present

## 2021-10-27 DIAGNOSIS — Z9181 History of falling: Secondary | ICD-10-CM | POA: Diagnosis not present

## 2021-10-27 DIAGNOSIS — K259 Gastric ulcer, unspecified as acute or chronic, without hemorrhage or perforation: Secondary | ICD-10-CM | POA: Diagnosis not present

## 2021-10-27 DIAGNOSIS — R1311 Dysphagia, oral phase: Secondary | ICD-10-CM | POA: Diagnosis not present

## 2021-10-27 DIAGNOSIS — F0390 Unspecified dementia without behavioral disturbance: Secondary | ICD-10-CM | POA: Diagnosis not present

## 2021-10-27 DIAGNOSIS — E43 Unspecified severe protein-calorie malnutrition: Secondary | ICD-10-CM | POA: Diagnosis not present

## 2021-10-27 DIAGNOSIS — Z7984 Long term (current) use of oral hypoglycemic drugs: Secondary | ICD-10-CM | POA: Diagnosis not present

## 2021-10-31 DIAGNOSIS — E1100 Type 2 diabetes mellitus with hyperosmolarity without nonketotic hyperglycemic-hyperosmolar coma (NKHHC): Secondary | ICD-10-CM | POA: Diagnosis not present

## 2021-10-31 DIAGNOSIS — A419 Sepsis, unspecified organism: Secondary | ICD-10-CM | POA: Diagnosis not present

## 2021-10-31 DIAGNOSIS — E43 Unspecified severe protein-calorie malnutrition: Secondary | ICD-10-CM | POA: Diagnosis not present

## 2021-10-31 DIAGNOSIS — G9341 Metabolic encephalopathy: Secondary | ICD-10-CM | POA: Diagnosis not present

## 2021-10-31 DIAGNOSIS — R1311 Dysphagia, oral phase: Secondary | ICD-10-CM | POA: Diagnosis not present

## 2021-10-31 DIAGNOSIS — F0390 Unspecified dementia without behavioral disturbance: Secondary | ICD-10-CM | POA: Diagnosis not present

## 2021-11-03 DIAGNOSIS — M47812 Spondylosis without myelopathy or radiculopathy, cervical region: Secondary | ICD-10-CM | POA: Diagnosis not present

## 2021-11-03 DIAGNOSIS — S0990XA Unspecified injury of head, initial encounter: Secondary | ICD-10-CM | POA: Diagnosis not present

## 2021-11-03 DIAGNOSIS — R404 Transient alteration of awareness: Secondary | ICD-10-CM | POA: Diagnosis not present

## 2021-11-03 DIAGNOSIS — A419 Sepsis, unspecified organism: Secondary | ICD-10-CM | POA: Diagnosis not present

## 2021-11-03 DIAGNOSIS — G4489 Other headache syndrome: Secondary | ICD-10-CM | POA: Diagnosis not present

## 2021-11-03 DIAGNOSIS — E43 Unspecified severe protein-calorie malnutrition: Secondary | ICD-10-CM | POA: Diagnosis not present

## 2021-11-03 DIAGNOSIS — S0101XA Laceration without foreign body of scalp, initial encounter: Secondary | ICD-10-CM | POA: Diagnosis not present

## 2021-11-03 DIAGNOSIS — S199XXA Unspecified injury of neck, initial encounter: Secondary | ICD-10-CM | POA: Diagnosis not present

## 2021-11-03 DIAGNOSIS — F0390 Unspecified dementia without behavioral disturbance: Secondary | ICD-10-CM | POA: Diagnosis not present

## 2021-11-03 DIAGNOSIS — W19XXXA Unspecified fall, initial encounter: Secondary | ICD-10-CM | POA: Diagnosis not present

## 2021-11-03 DIAGNOSIS — Z7401 Bed confinement status: Secondary | ICD-10-CM | POA: Diagnosis not present

## 2021-11-03 DIAGNOSIS — G9341 Metabolic encephalopathy: Secondary | ICD-10-CM | POA: Diagnosis not present

## 2021-11-03 DIAGNOSIS — E1100 Type 2 diabetes mellitus with hyperosmolarity without nonketotic hyperglycemic-hyperosmolar coma (NKHHC): Secondary | ICD-10-CM | POA: Diagnosis not present

## 2021-11-03 DIAGNOSIS — Z8673 Personal history of transient ischemic attack (TIA), and cerebral infarction without residual deficits: Secondary | ICD-10-CM | POA: Diagnosis not present

## 2021-11-03 DIAGNOSIS — R1311 Dysphagia, oral phase: Secondary | ICD-10-CM | POA: Diagnosis not present

## 2021-11-03 DIAGNOSIS — G319 Degenerative disease of nervous system, unspecified: Secondary | ICD-10-CM | POA: Diagnosis not present

## 2021-11-06 DIAGNOSIS — Z7401 Bed confinement status: Secondary | ICD-10-CM | POA: Diagnosis not present

## 2021-11-06 DIAGNOSIS — I10 Essential (primary) hypertension: Secondary | ICD-10-CM | POA: Diagnosis not present

## 2021-11-06 DIAGNOSIS — M25561 Pain in right knee: Secondary | ICD-10-CM | POA: Diagnosis not present

## 2021-11-06 DIAGNOSIS — G8929 Other chronic pain: Secondary | ICD-10-CM | POA: Diagnosis not present

## 2021-11-06 DIAGNOSIS — W19XXXA Unspecified fall, initial encounter: Secondary | ICD-10-CM | POA: Diagnosis not present

## 2021-11-06 DIAGNOSIS — R220 Localized swelling, mass and lump, head: Secondary | ICD-10-CM | POA: Diagnosis not present

## 2021-11-06 DIAGNOSIS — R519 Headache, unspecified: Secondary | ICD-10-CM | POA: Diagnosis not present

## 2021-11-06 DIAGNOSIS — E43 Unspecified severe protein-calorie malnutrition: Secondary | ICD-10-CM | POA: Diagnosis not present

## 2021-11-06 DIAGNOSIS — Z043 Encounter for examination and observation following other accident: Secondary | ICD-10-CM | POA: Diagnosis not present

## 2021-11-06 DIAGNOSIS — R1311 Dysphagia, oral phase: Secondary | ICD-10-CM | POA: Diagnosis not present

## 2021-11-06 DIAGNOSIS — G9341 Metabolic encephalopathy: Secondary | ICD-10-CM | POA: Diagnosis not present

## 2021-11-06 DIAGNOSIS — M25461 Effusion, right knee: Secondary | ICD-10-CM | POA: Diagnosis not present

## 2021-11-06 DIAGNOSIS — A419 Sepsis, unspecified organism: Secondary | ICD-10-CM | POA: Diagnosis not present

## 2021-11-06 DIAGNOSIS — M1712 Unilateral primary osteoarthritis, left knee: Secondary | ICD-10-CM | POA: Diagnosis not present

## 2021-11-06 DIAGNOSIS — F0390 Unspecified dementia without behavioral disturbance: Secondary | ICD-10-CM | POA: Diagnosis not present

## 2021-11-06 DIAGNOSIS — S0003XA Contusion of scalp, initial encounter: Secondary | ICD-10-CM | POA: Diagnosis not present

## 2021-11-06 DIAGNOSIS — G4489 Other headache syndrome: Secondary | ICD-10-CM | POA: Diagnosis not present

## 2021-11-06 DIAGNOSIS — M25562 Pain in left knee: Secondary | ICD-10-CM | POA: Diagnosis not present

## 2021-11-06 DIAGNOSIS — E1100 Type 2 diabetes mellitus with hyperosmolarity without nonketotic hyperglycemic-hyperosmolar coma (NKHHC): Secondary | ICD-10-CM | POA: Diagnosis not present

## 2021-11-07 DIAGNOSIS — E1100 Type 2 diabetes mellitus with hyperosmolarity without nonketotic hyperglycemic-hyperosmolar coma (NKHHC): Secondary | ICD-10-CM | POA: Diagnosis not present

## 2021-11-07 DIAGNOSIS — R1311 Dysphagia, oral phase: Secondary | ICD-10-CM | POA: Diagnosis not present

## 2021-11-07 DIAGNOSIS — G9341 Metabolic encephalopathy: Secondary | ICD-10-CM | POA: Diagnosis not present

## 2021-11-07 DIAGNOSIS — A419 Sepsis, unspecified organism: Secondary | ICD-10-CM | POA: Diagnosis not present

## 2021-11-07 DIAGNOSIS — E43 Unspecified severe protein-calorie malnutrition: Secondary | ICD-10-CM | POA: Diagnosis not present

## 2021-11-07 DIAGNOSIS — F0390 Unspecified dementia without behavioral disturbance: Secondary | ICD-10-CM | POA: Diagnosis not present

## 2021-11-09 DIAGNOSIS — R296 Repeated falls: Secondary | ICD-10-CM | POA: Diagnosis not present

## 2021-11-09 DIAGNOSIS — S0101XD Laceration without foreign body of scalp, subsequent encounter: Secondary | ICD-10-CM | POA: Diagnosis not present

## 2021-11-09 DIAGNOSIS — G301 Alzheimer's disease with late onset: Secondary | ICD-10-CM | POA: Diagnosis not present

## 2021-11-09 DIAGNOSIS — W19XXXA Unspecified fall, initial encounter: Secondary | ICD-10-CM | POA: Diagnosis not present

## 2021-11-10 DIAGNOSIS — F0390 Unspecified dementia without behavioral disturbance: Secondary | ICD-10-CM | POA: Diagnosis not present

## 2021-11-10 DIAGNOSIS — R1311 Dysphagia, oral phase: Secondary | ICD-10-CM | POA: Diagnosis not present

## 2021-11-10 DIAGNOSIS — G9341 Metabolic encephalopathy: Secondary | ICD-10-CM | POA: Diagnosis not present

## 2021-11-10 DIAGNOSIS — A419 Sepsis, unspecified organism: Secondary | ICD-10-CM | POA: Diagnosis not present

## 2021-11-10 DIAGNOSIS — E43 Unspecified severe protein-calorie malnutrition: Secondary | ICD-10-CM | POA: Diagnosis not present

## 2021-11-10 DIAGNOSIS — E1100 Type 2 diabetes mellitus with hyperosmolarity without nonketotic hyperglycemic-hyperosmolar coma (NKHHC): Secondary | ICD-10-CM | POA: Diagnosis not present

## 2021-11-13 DIAGNOSIS — G9341 Metabolic encephalopathy: Secondary | ICD-10-CM | POA: Diagnosis not present

## 2021-11-13 DIAGNOSIS — F0390 Unspecified dementia without behavioral disturbance: Secondary | ICD-10-CM | POA: Diagnosis not present

## 2021-11-13 DIAGNOSIS — A419 Sepsis, unspecified organism: Secondary | ICD-10-CM | POA: Diagnosis not present

## 2021-11-13 DIAGNOSIS — E43 Unspecified severe protein-calorie malnutrition: Secondary | ICD-10-CM | POA: Diagnosis not present

## 2021-11-13 DIAGNOSIS — E1100 Type 2 diabetes mellitus with hyperosmolarity without nonketotic hyperglycemic-hyperosmolar coma (NKHHC): Secondary | ICD-10-CM | POA: Diagnosis not present

## 2021-11-13 DIAGNOSIS — R1311 Dysphagia, oral phase: Secondary | ICD-10-CM | POA: Diagnosis not present

## 2021-11-15 DIAGNOSIS — A419 Sepsis, unspecified organism: Secondary | ICD-10-CM | POA: Diagnosis not present

## 2021-11-15 DIAGNOSIS — G9341 Metabolic encephalopathy: Secondary | ICD-10-CM | POA: Diagnosis not present

## 2021-11-15 DIAGNOSIS — E1100 Type 2 diabetes mellitus with hyperosmolarity without nonketotic hyperglycemic-hyperosmolar coma (NKHHC): Secondary | ICD-10-CM | POA: Diagnosis not present

## 2021-11-15 DIAGNOSIS — E43 Unspecified severe protein-calorie malnutrition: Secondary | ICD-10-CM | POA: Diagnosis not present

## 2021-11-15 DIAGNOSIS — R1311 Dysphagia, oral phase: Secondary | ICD-10-CM | POA: Diagnosis not present

## 2021-11-15 DIAGNOSIS — F0390 Unspecified dementia without behavioral disturbance: Secondary | ICD-10-CM | POA: Diagnosis not present

## 2021-11-16 DIAGNOSIS — S0101XD Laceration without foreign body of scalp, subsequent encounter: Secondary | ICD-10-CM | POA: Diagnosis not present

## 2021-11-16 DIAGNOSIS — Z4802 Encounter for removal of sutures: Secondary | ICD-10-CM | POA: Diagnosis not present

## 2021-11-17 DIAGNOSIS — E1100 Type 2 diabetes mellitus with hyperosmolarity without nonketotic hyperglycemic-hyperosmolar coma (NKHHC): Secondary | ICD-10-CM | POA: Diagnosis not present

## 2021-11-17 DIAGNOSIS — E43 Unspecified severe protein-calorie malnutrition: Secondary | ICD-10-CM | POA: Diagnosis not present

## 2021-11-17 DIAGNOSIS — F0390 Unspecified dementia without behavioral disturbance: Secondary | ICD-10-CM | POA: Diagnosis not present

## 2021-11-17 DIAGNOSIS — Z743 Need for continuous supervision: Secondary | ICD-10-CM | POA: Diagnosis not present

## 2021-11-17 DIAGNOSIS — A419 Sepsis, unspecified organism: Secondary | ICD-10-CM | POA: Diagnosis not present

## 2021-11-17 DIAGNOSIS — R58 Hemorrhage, not elsewhere classified: Secondary | ICD-10-CM | POA: Diagnosis not present

## 2021-11-17 DIAGNOSIS — Z0000 Encounter for general adult medical examination without abnormal findings: Secondary | ICD-10-CM | POA: Diagnosis not present

## 2021-11-17 DIAGNOSIS — Z0389 Encounter for observation for other suspected diseases and conditions ruled out: Secondary | ICD-10-CM | POA: Diagnosis not present

## 2021-11-17 DIAGNOSIS — R279 Unspecified lack of coordination: Secondary | ICD-10-CM | POA: Diagnosis not present

## 2021-11-17 DIAGNOSIS — R1311 Dysphagia, oral phase: Secondary | ICD-10-CM | POA: Diagnosis not present

## 2021-11-17 DIAGNOSIS — G9341 Metabolic encephalopathy: Secondary | ICD-10-CM | POA: Diagnosis not present

## 2021-11-17 DIAGNOSIS — R410 Disorientation, unspecified: Secondary | ICD-10-CM | POA: Diagnosis not present

## 2021-11-21 DIAGNOSIS — E1100 Type 2 diabetes mellitus with hyperosmolarity without nonketotic hyperglycemic-hyperosmolar coma (NKHHC): Secondary | ICD-10-CM | POA: Diagnosis not present

## 2021-11-21 DIAGNOSIS — G9341 Metabolic encephalopathy: Secondary | ICD-10-CM | POA: Diagnosis not present

## 2021-11-21 DIAGNOSIS — F0390 Unspecified dementia without behavioral disturbance: Secondary | ICD-10-CM | POA: Diagnosis not present

## 2021-11-21 DIAGNOSIS — A419 Sepsis, unspecified organism: Secondary | ICD-10-CM | POA: Diagnosis not present

## 2021-11-21 DIAGNOSIS — E43 Unspecified severe protein-calorie malnutrition: Secondary | ICD-10-CM | POA: Diagnosis not present

## 2021-11-21 DIAGNOSIS — R1311 Dysphagia, oral phase: Secondary | ICD-10-CM | POA: Diagnosis not present

## 2021-11-22 DIAGNOSIS — E1100 Type 2 diabetes mellitus with hyperosmolarity without nonketotic hyperglycemic-hyperosmolar coma (NKHHC): Secondary | ICD-10-CM | POA: Diagnosis not present

## 2021-11-22 DIAGNOSIS — E43 Unspecified severe protein-calorie malnutrition: Secondary | ICD-10-CM | POA: Diagnosis not present

## 2021-11-22 DIAGNOSIS — G9341 Metabolic encephalopathy: Secondary | ICD-10-CM | POA: Diagnosis not present

## 2021-11-22 DIAGNOSIS — R1311 Dysphagia, oral phase: Secondary | ICD-10-CM | POA: Diagnosis not present

## 2021-11-22 DIAGNOSIS — F0390 Unspecified dementia without behavioral disturbance: Secondary | ICD-10-CM | POA: Diagnosis not present

## 2021-11-22 DIAGNOSIS — A419 Sepsis, unspecified organism: Secondary | ICD-10-CM | POA: Diagnosis not present

## 2021-11-26 DIAGNOSIS — D649 Anemia, unspecified: Secondary | ICD-10-CM | POA: Diagnosis not present

## 2021-11-26 DIAGNOSIS — F0390 Unspecified dementia without behavioral disturbance: Secondary | ICD-10-CM | POA: Diagnosis not present

## 2021-11-26 DIAGNOSIS — G9341 Metabolic encephalopathy: Secondary | ICD-10-CM | POA: Diagnosis not present

## 2021-11-26 DIAGNOSIS — Z9181 History of falling: Secondary | ICD-10-CM | POA: Diagnosis not present

## 2021-11-26 DIAGNOSIS — K259 Gastric ulcer, unspecified as acute or chronic, without hemorrhage or perforation: Secondary | ICD-10-CM | POA: Diagnosis not present

## 2021-11-26 DIAGNOSIS — E1100 Type 2 diabetes mellitus with hyperosmolarity without nonketotic hyperglycemic-hyperosmolar coma (NKHHC): Secondary | ICD-10-CM | POA: Diagnosis not present

## 2021-11-26 DIAGNOSIS — E43 Unspecified severe protein-calorie malnutrition: Secondary | ICD-10-CM | POA: Diagnosis not present

## 2021-11-26 DIAGNOSIS — N289 Disorder of kidney and ureter, unspecified: Secondary | ICD-10-CM | POA: Diagnosis not present

## 2021-11-26 DIAGNOSIS — Z7984 Long term (current) use of oral hypoglycemic drugs: Secondary | ICD-10-CM | POA: Diagnosis not present

## 2021-11-26 DIAGNOSIS — R1311 Dysphagia, oral phase: Secondary | ICD-10-CM | POA: Diagnosis not present

## 2021-11-26 DIAGNOSIS — A419 Sepsis, unspecified organism: Secondary | ICD-10-CM | POA: Diagnosis not present

## 2021-11-27 DIAGNOSIS — S0003XA Contusion of scalp, initial encounter: Secondary | ICD-10-CM | POA: Diagnosis not present

## 2021-11-27 DIAGNOSIS — G319 Degenerative disease of nervous system, unspecified: Secondary | ICD-10-CM | POA: Diagnosis not present

## 2021-11-27 DIAGNOSIS — I6529 Occlusion and stenosis of unspecified carotid artery: Secondary | ICD-10-CM | POA: Diagnosis not present

## 2021-11-27 DIAGNOSIS — M47812 Spondylosis without myelopathy or radiculopathy, cervical region: Secondary | ICD-10-CM | POA: Diagnosis not present

## 2021-11-27 DIAGNOSIS — R404 Transient alteration of awareness: Secondary | ICD-10-CM | POA: Diagnosis not present

## 2021-11-27 DIAGNOSIS — S0101XA Laceration without foreign body of scalp, initial encounter: Secondary | ICD-10-CM | POA: Diagnosis not present

## 2021-11-27 DIAGNOSIS — R58 Hemorrhage, not elsewhere classified: Secondary | ICD-10-CM | POA: Diagnosis not present

## 2021-11-27 DIAGNOSIS — S199XXA Unspecified injury of neck, initial encounter: Secondary | ICD-10-CM | POA: Diagnosis not present

## 2021-11-27 DIAGNOSIS — R0902 Hypoxemia: Secondary | ICD-10-CM | POA: Diagnosis not present

## 2021-11-27 DIAGNOSIS — Z043 Encounter for examination and observation following other accident: Secondary | ICD-10-CM | POA: Diagnosis not present

## 2021-11-29 DIAGNOSIS — A419 Sepsis, unspecified organism: Secondary | ICD-10-CM | POA: Diagnosis not present

## 2021-11-29 DIAGNOSIS — F0390 Unspecified dementia without behavioral disturbance: Secondary | ICD-10-CM | POA: Diagnosis not present

## 2021-11-29 DIAGNOSIS — E43 Unspecified severe protein-calorie malnutrition: Secondary | ICD-10-CM | POA: Diagnosis not present

## 2021-11-29 DIAGNOSIS — R1311 Dysphagia, oral phase: Secondary | ICD-10-CM | POA: Diagnosis not present

## 2021-11-29 DIAGNOSIS — E1100 Type 2 diabetes mellitus with hyperosmolarity without nonketotic hyperglycemic-hyperosmolar coma (NKHHC): Secondary | ICD-10-CM | POA: Diagnosis not present

## 2021-11-29 DIAGNOSIS — G9341 Metabolic encephalopathy: Secondary | ICD-10-CM | POA: Diagnosis not present

## 2021-11-30 DIAGNOSIS — R1311 Dysphagia, oral phase: Secondary | ICD-10-CM | POA: Diagnosis not present

## 2021-11-30 DIAGNOSIS — A419 Sepsis, unspecified organism: Secondary | ICD-10-CM | POA: Diagnosis not present

## 2021-11-30 DIAGNOSIS — F0390 Unspecified dementia without behavioral disturbance: Secondary | ICD-10-CM | POA: Diagnosis not present

## 2021-11-30 DIAGNOSIS — E43 Unspecified severe protein-calorie malnutrition: Secondary | ICD-10-CM | POA: Diagnosis not present

## 2021-11-30 DIAGNOSIS — G9341 Metabolic encephalopathy: Secondary | ICD-10-CM | POA: Diagnosis not present

## 2021-11-30 DIAGNOSIS — E1100 Type 2 diabetes mellitus with hyperosmolarity without nonketotic hyperglycemic-hyperosmolar coma (NKHHC): Secondary | ICD-10-CM | POA: Diagnosis not present

## 2021-12-06 DIAGNOSIS — E1100 Type 2 diabetes mellitus with hyperosmolarity without nonketotic hyperglycemic-hyperosmolar coma (NKHHC): Secondary | ICD-10-CM | POA: Diagnosis not present

## 2021-12-06 DIAGNOSIS — R1311 Dysphagia, oral phase: Secondary | ICD-10-CM | POA: Diagnosis not present

## 2021-12-06 DIAGNOSIS — F0390 Unspecified dementia without behavioral disturbance: Secondary | ICD-10-CM | POA: Diagnosis not present

## 2021-12-06 DIAGNOSIS — E43 Unspecified severe protein-calorie malnutrition: Secondary | ICD-10-CM | POA: Diagnosis not present

## 2021-12-06 DIAGNOSIS — A419 Sepsis, unspecified organism: Secondary | ICD-10-CM | POA: Diagnosis not present

## 2021-12-06 DIAGNOSIS — G9341 Metabolic encephalopathy: Secondary | ICD-10-CM | POA: Diagnosis not present

## 2021-12-07 DIAGNOSIS — R1311 Dysphagia, oral phase: Secondary | ICD-10-CM | POA: Diagnosis not present

## 2021-12-07 DIAGNOSIS — R296 Repeated falls: Secondary | ICD-10-CM | POA: Diagnosis not present

## 2021-12-07 DIAGNOSIS — G9341 Metabolic encephalopathy: Secondary | ICD-10-CM | POA: Diagnosis not present

## 2021-12-07 DIAGNOSIS — A419 Sepsis, unspecified organism: Secondary | ICD-10-CM | POA: Diagnosis not present

## 2021-12-07 DIAGNOSIS — G301 Alzheimer's disease with late onset: Secondary | ICD-10-CM | POA: Diagnosis not present

## 2021-12-07 DIAGNOSIS — E43 Unspecified severe protein-calorie malnutrition: Secondary | ICD-10-CM | POA: Diagnosis not present

## 2021-12-07 DIAGNOSIS — N8110 Cystocele, unspecified: Secondary | ICD-10-CM | POA: Diagnosis not present

## 2021-12-07 DIAGNOSIS — E1100 Type 2 diabetes mellitus with hyperosmolarity without nonketotic hyperglycemic-hyperosmolar coma (NKHHC): Secondary | ICD-10-CM | POA: Diagnosis not present

## 2021-12-07 DIAGNOSIS — W19XXXA Unspecified fall, initial encounter: Secondary | ICD-10-CM | POA: Diagnosis not present

## 2021-12-07 DIAGNOSIS — F0390 Unspecified dementia without behavioral disturbance: Secondary | ICD-10-CM | POA: Diagnosis not present

## 2021-12-15 DIAGNOSIS — E43 Unspecified severe protein-calorie malnutrition: Secondary | ICD-10-CM | POA: Diagnosis not present

## 2021-12-15 DIAGNOSIS — E1100 Type 2 diabetes mellitus with hyperosmolarity without nonketotic hyperglycemic-hyperosmolar coma (NKHHC): Secondary | ICD-10-CM | POA: Diagnosis not present

## 2021-12-15 DIAGNOSIS — F0390 Unspecified dementia without behavioral disturbance: Secondary | ICD-10-CM | POA: Diagnosis not present

## 2021-12-15 DIAGNOSIS — A419 Sepsis, unspecified organism: Secondary | ICD-10-CM | POA: Diagnosis not present

## 2021-12-15 DIAGNOSIS — G9341 Metabolic encephalopathy: Secondary | ICD-10-CM | POA: Diagnosis not present

## 2021-12-15 DIAGNOSIS — R1311 Dysphagia, oral phase: Secondary | ICD-10-CM | POA: Diagnosis not present

## 2021-12-21 DIAGNOSIS — E43 Unspecified severe protein-calorie malnutrition: Secondary | ICD-10-CM | POA: Diagnosis not present

## 2021-12-21 DIAGNOSIS — F0390 Unspecified dementia without behavioral disturbance: Secondary | ICD-10-CM | POA: Diagnosis not present

## 2021-12-21 DIAGNOSIS — G9341 Metabolic encephalopathy: Secondary | ICD-10-CM | POA: Diagnosis not present

## 2021-12-21 DIAGNOSIS — A419 Sepsis, unspecified organism: Secondary | ICD-10-CM | POA: Diagnosis not present

## 2021-12-21 DIAGNOSIS — R1311 Dysphagia, oral phase: Secondary | ICD-10-CM | POA: Diagnosis not present

## 2021-12-21 DIAGNOSIS — E1100 Type 2 diabetes mellitus with hyperosmolarity without nonketotic hyperglycemic-hyperosmolar coma (NKHHC): Secondary | ICD-10-CM | POA: Diagnosis not present

## 2021-12-31 DEATH — deceased
